# Patient Record
Sex: Female | Born: 1935 | Race: White | Hispanic: No | Smoking: Never smoker
Health system: Southern US, Community
[De-identification: ages and names within clinical notes are randomized; demographics above are authoritative.]

## PROBLEM LIST (undated history)

## (undated) DIAGNOSIS — H3530 Unspecified macular degeneration: Secondary | ICD-10-CM

## (undated) DIAGNOSIS — J449 Chronic obstructive pulmonary disease, unspecified: Secondary | ICD-10-CM

## (undated) DIAGNOSIS — F419 Anxiety disorder, unspecified: Secondary | ICD-10-CM

## (undated) DIAGNOSIS — I447 Left bundle-branch block, unspecified: Secondary | ICD-10-CM

## (undated) DIAGNOSIS — I4891 Unspecified atrial fibrillation: Secondary | ICD-10-CM

## (undated) DIAGNOSIS — F329 Major depressive disorder, single episode, unspecified: Secondary | ICD-10-CM

## (undated) DIAGNOSIS — I43 Cardiomyopathy in diseases classified elsewhere: Secondary | ICD-10-CM

## (undated) DIAGNOSIS — IMO0001 Reserved for inherently not codable concepts without codable children: Secondary | ICD-10-CM

## (undated) DIAGNOSIS — M1990 Unspecified osteoarthritis, unspecified site: Secondary | ICD-10-CM

## (undated) DIAGNOSIS — K219 Gastro-esophageal reflux disease without esophagitis: Secondary | ICD-10-CM

## (undated) DIAGNOSIS — R000 Tachycardia, unspecified: Secondary | ICD-10-CM

## (undated) DIAGNOSIS — I509 Heart failure, unspecified: Secondary | ICD-10-CM

## (undated) DIAGNOSIS — K5792 Diverticulitis of intestine, part unspecified, without perforation or abscess without bleeding: Secondary | ICD-10-CM

## (undated) DIAGNOSIS — I10 Essential (primary) hypertension: Secondary | ICD-10-CM

## (undated) HISTORY — DX: Tachycardia, unspecified: I43

## (undated) HISTORY — DX: Unspecified macular degeneration: H35.30

## (undated) HISTORY — DX: Tachycardia, unspecified: R00.0

## (undated) HISTORY — DX: Reserved for inherently not codable concepts without codable children: IMO0001

## (undated) HISTORY — DX: Unspecified atrial fibrillation: I48.91

## (undated) HISTORY — DX: Anxiety disorder, unspecified: F41.9

## (undated) HISTORY — DX: Unspecified osteoarthritis, unspecified site: M19.90

## (undated) HISTORY — DX: Essential (primary) hypertension: I10

## (undated) HISTORY — DX: Gastro-esophageal reflux disease without esophagitis: K21.9

## (undated) HISTORY — DX: Left bundle-branch block, unspecified: I44.7

## (undated) HISTORY — DX: Major depressive disorder, single episode, unspecified: F32.9

## (undated) HISTORY — DX: Diverticulitis of intestine, part unspecified, without perforation or abscess without bleeding: K57.92

## (undated) HISTORY — PX: OTHER SURGICAL HISTORY: SHX169

## (undated) HISTORY — DX: Chronic obstructive pulmonary disease, unspecified: J44.9

## (undated) HISTORY — DX: Heart failure, unspecified: I50.9

---

## 1980-01-18 HISTORY — PX: ABDOMINAL HYSTERECTOMY: SHX81

## 1991-01-18 HISTORY — PX: CHOLECYSTECTOMY: SHX55

## 2006-02-17 ENCOUNTER — Ambulatory Visit: Payer: Self-pay | Admitting: Internal Medicine

## 2006-03-02 ENCOUNTER — Ambulatory Visit: Payer: Self-pay | Admitting: Internal Medicine

## 2006-03-02 ENCOUNTER — Inpatient Hospital Stay (HOSPITAL_COMMUNITY): Admission: AD | Admit: 2006-03-02 | Discharge: 2006-03-07 | Payer: Self-pay | Admitting: Internal Medicine

## 2006-03-17 ENCOUNTER — Ambulatory Visit: Payer: Self-pay | Admitting: Internal Medicine

## 2006-05-02 ENCOUNTER — Inpatient Hospital Stay (HOSPITAL_COMMUNITY): Admission: RE | Admit: 2006-05-02 | Discharge: 2006-05-05 | Payer: Self-pay | Admitting: Orthopedic Surgery

## 2006-06-02 ENCOUNTER — Ambulatory Visit: Payer: Self-pay | Admitting: Internal Medicine

## 2006-12-11 ENCOUNTER — Ambulatory Visit: Payer: Self-pay | Admitting: Internal Medicine

## 2006-12-11 LAB — CONVERTED CEMR LAB
ALT: 20 units/L (ref 0–35)
Bilirubin, Direct: 0.1 mg/dL (ref 0.0–0.3)
Calcium: 9.3 mg/dL (ref 8.4–10.5)
GFR calc Af Amer: 89 mL/min
GFR calc non Af Amer: 73 mL/min
Glucose, Bld: 91 mg/dL (ref 70–99)
Magnesium: 2.1 mg/dL (ref 1.5–2.5)

## 2007-07-05 ENCOUNTER — Ambulatory Visit: Payer: Self-pay | Admitting: Internal Medicine

## 2008-06-25 ENCOUNTER — Encounter (INDEPENDENT_AMBULATORY_CARE_PROVIDER_SITE_OTHER): Payer: Self-pay | Admitting: *Deleted

## 2008-08-22 DIAGNOSIS — I4891 Unspecified atrial fibrillation: Secondary | ICD-10-CM | POA: Insufficient documentation

## 2008-08-22 DIAGNOSIS — M159 Polyosteoarthritis, unspecified: Secondary | ICD-10-CM | POA: Insufficient documentation

## 2008-08-22 DIAGNOSIS — I429 Cardiomyopathy, unspecified: Secondary | ICD-10-CM

## 2008-08-25 ENCOUNTER — Ambulatory Visit: Payer: Self-pay | Admitting: Internal Medicine

## 2008-09-10 ENCOUNTER — Telehealth: Payer: Self-pay | Admitting: Internal Medicine

## 2008-09-15 ENCOUNTER — Ambulatory Visit: Payer: Self-pay | Admitting: Internal Medicine

## 2008-09-16 IMAGING — CR DG CHEST 2V
2 series · 2 of 2 positions shown · non-contrast
Comparison: None.

CLINICAL DATA: Right knee osteoarthritis. Preoperative evaluation. History of
asthma.

CHEST - 2 VIEW

[view not recorded (1 of 2)]
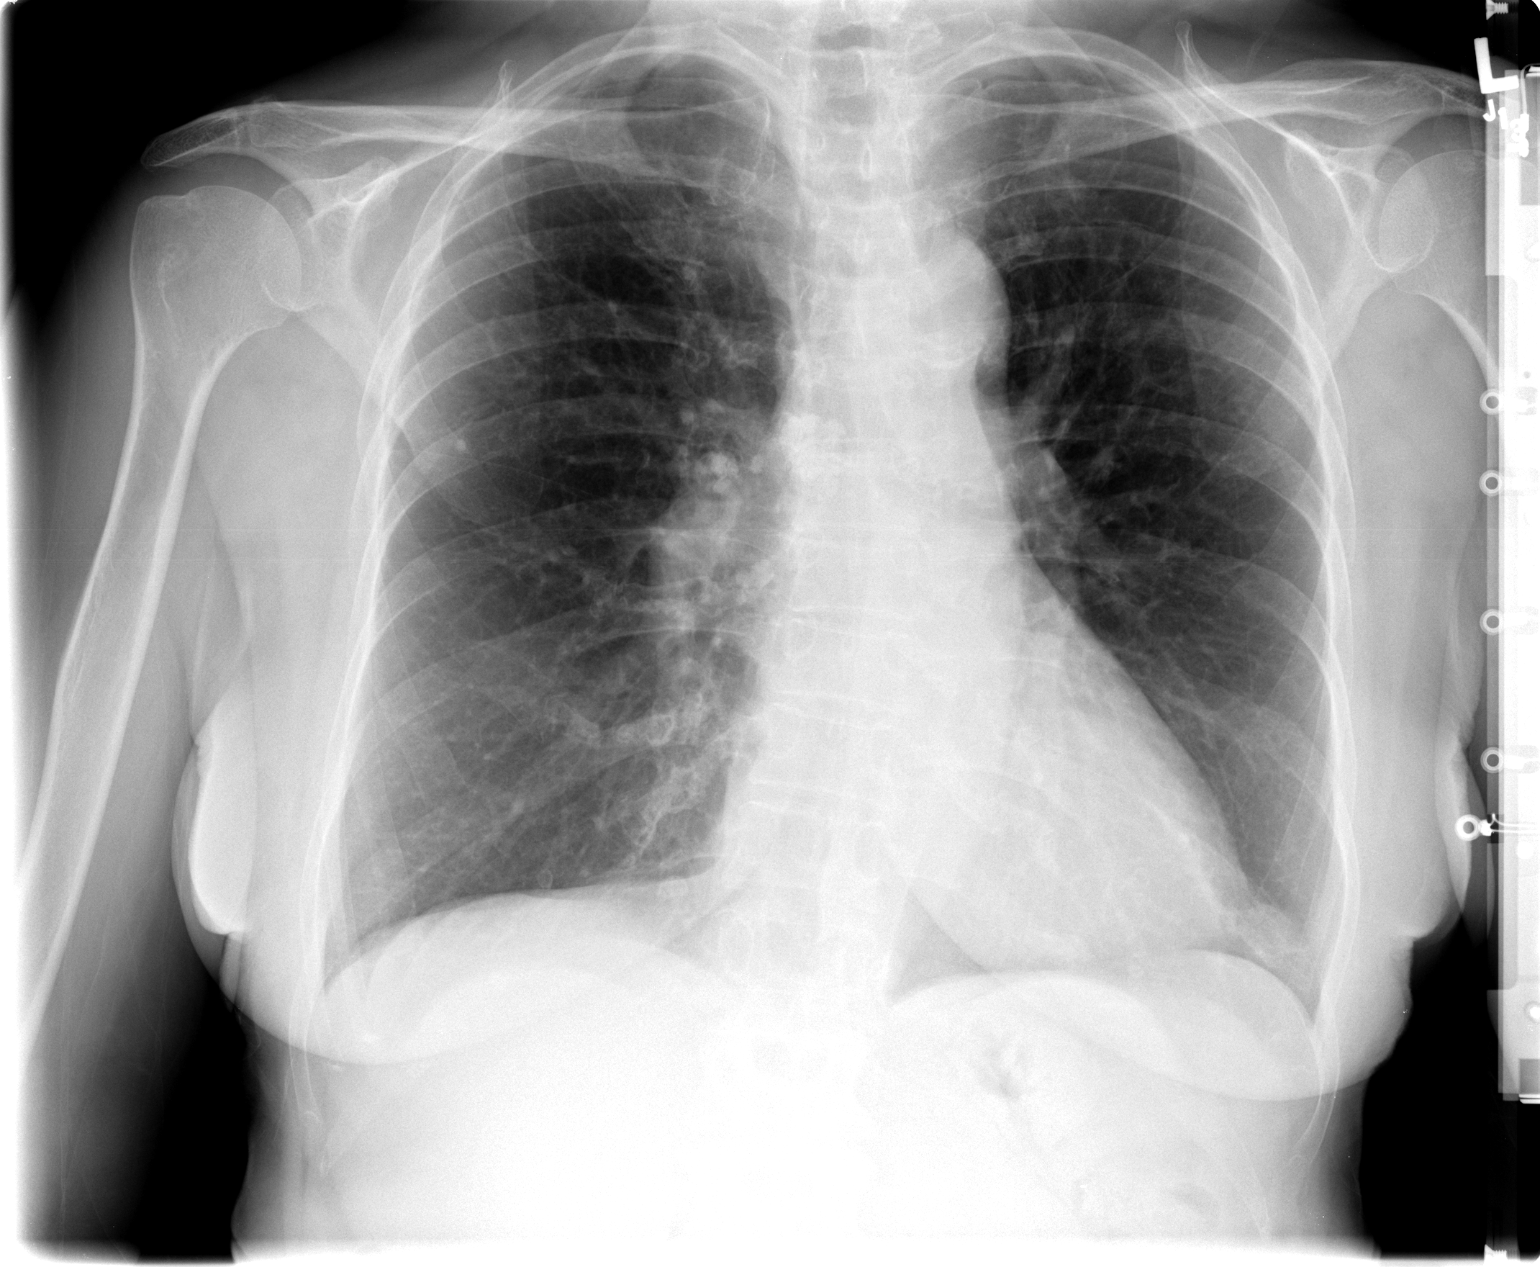

[view not recorded (2 of 2)]
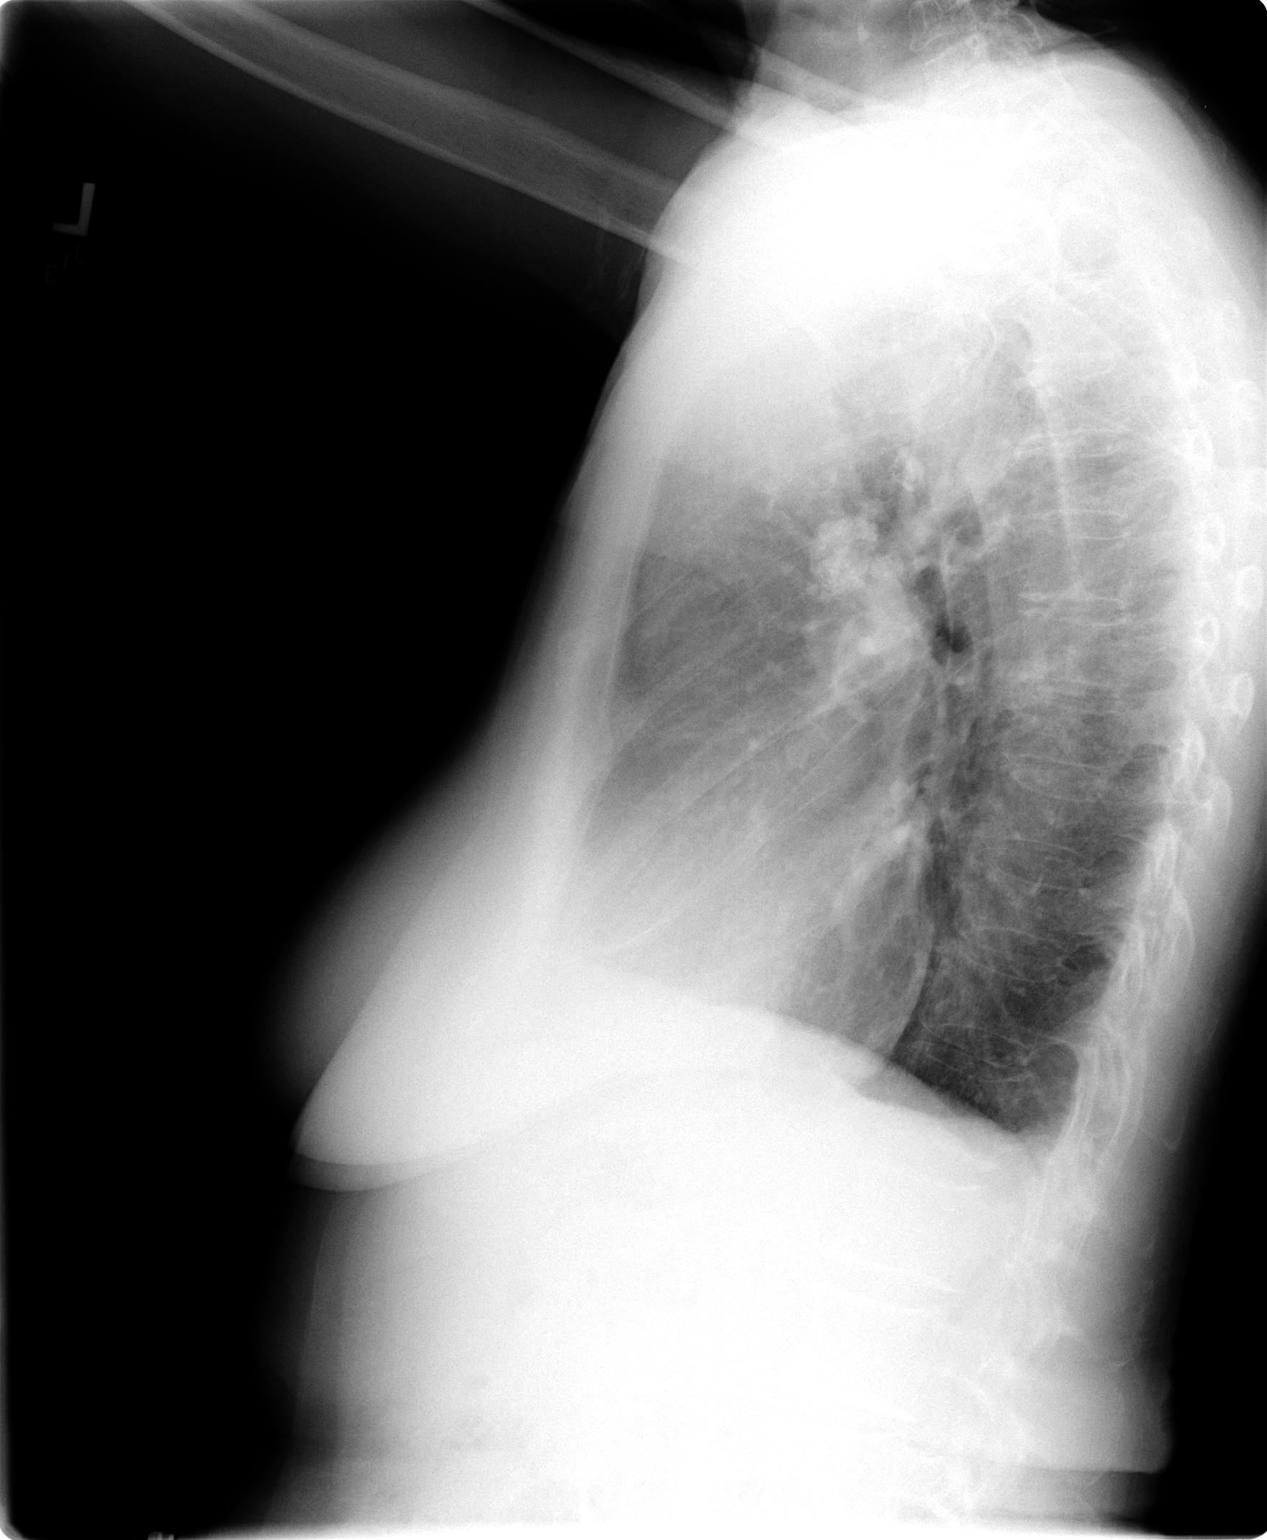

[2 of 2 positions shown; findings below may reference images not displayed]

FINDINGS: Borderline enlarged cardiac silhouette. Mild changes of COPD and
chronic bronchitis. Small right upper lung zone calcified granuloma and
associated calcified right hilar and mediastinal lymph nodes. Mild scoliosis.

IMPRESSION

1. Borderline cardiomegaly.
2. Mild changes of COPD and chronic bronchitis.
3. Previous granulomatous infection.

## 2008-10-27 ENCOUNTER — Ambulatory Visit: Payer: Self-pay | Admitting: Internal Medicine

## 2008-10-29 ENCOUNTER — Encounter: Payer: Self-pay | Admitting: Internal Medicine

## 2009-03-02 ENCOUNTER — Ambulatory Visit: Payer: Self-pay | Admitting: Internal Medicine

## 2009-06-05 ENCOUNTER — Telehealth (INDEPENDENT_AMBULATORY_CARE_PROVIDER_SITE_OTHER): Payer: Self-pay | Admitting: *Deleted

## 2009-06-09 ENCOUNTER — Ambulatory Visit: Payer: Self-pay | Admitting: Cardiovascular Disease

## 2009-06-12 ENCOUNTER — Telehealth: Payer: Self-pay | Admitting: Cardiovascular Disease

## 2009-07-28 ENCOUNTER — Telehealth: Payer: Self-pay | Admitting: Cardiovascular Disease

## 2009-09-11 ENCOUNTER — Ambulatory Visit: Payer: Self-pay | Admitting: Internal Medicine

## 2009-09-29 ENCOUNTER — Encounter: Payer: Self-pay | Admitting: Internal Medicine

## 2009-10-21 ENCOUNTER — Telehealth: Payer: Self-pay | Admitting: Internal Medicine

## 2010-02-16 NOTE — Progress Notes (Signed)
  Phone Note From Other Clinic   Action Taken: Provider Notified Summary of Call: Spoke with Dr. Tenny Craw after faxing copy of EKG and phone note-to get some labs on patient-CBC,BMP,Digoxin level,TSH-patient's BP now 125/60 and patient states feels some better.  Patient to go to [facility]. for labs and then go home.  If any further problems, to go to ED.  Scheduled f/u appt with patient to see Dr. Freida Busman on Tues. 5/24.

## 2010-02-16 NOTE — Progress Notes (Signed)
  Phone Note Call from Patient   Summary of Call: Patient stopped by office for BP check-[facility]p [facility] 86/43.  BP in office 122/73 with HR 69.  States she walks twice a day and this morning while walking felt more out of breath.  Advised against walking on such hot days and to increase fluid intake. Initial call taken by: Dessie Coma,  July 28, 2009 2:48 PM

## 2010-02-16 NOTE — Progress Notes (Signed)
  Phone Note Call from Patient   Caller: Patient Summary of Call: T.C. from patient to find out results of Echo-per Dr. Freida Busman, Echo was OK. Initial call taken by: Dessie Coma,  Jun 12, 2009 11:24 AM

## 2010-02-16 NOTE — Progress Notes (Signed)
  Phone Note Call from Patient   Caller: Patient Summary of Call: Patient stopped by office to have BP and HR checked.  Had colonoscopy and EGD last week and HR was in the 90s.  Today BP 129/76 HR 67.  Patient states she is under a lot of stress.  Family issues.  Initial call taken by: Dessie Coma  LPN,  October 21, 2009 10:16 AM

## 2010-02-16 NOTE — Letter (Signed)
Summary: [facility] Medical Clearance   [facility] Medical Clearance   Imported By: Roderic Ovens 10/16/2009 16:26:52  _____________________________________________________________________  External Attachment:    Type:   Image     Comment:   External Document

## 2010-02-16 NOTE — Progress Notes (Signed)
  Phone Note Call from Patient   Summary of Call: Patient stopped by office-c/o feeling shaky,nervous.  Has been to [facility] and nurse told her after checking her pulse that she thought she was in atrial fib.  The nurse did nothing else since she has moved her records to [facility] and is currently a patient of Dr. Odessa Fleming.  BP 140/66 HR 81 EKG done and will be faxed to [facility] for interpretation since there is not a  MD in office at present time.  Initial call taken by: Dessie Coma,  Jun 05, 2009 11:19 AM

## 2010-02-17 ENCOUNTER — Encounter: Payer: Self-pay | Admitting: Internal Medicine

## 2010-02-26 ENCOUNTER — Telehealth: Payer: Self-pay | Admitting: Cardiovascular Disease

## 2010-03-02 ENCOUNTER — Telehealth: Payer: Self-pay | Admitting: Cardiovascular Disease

## 2010-03-04 NOTE — Progress Notes (Signed)
  Phone Note Call from Patient   Caller: Patient Summary of Call: T.C. from patient-went to ED today for ashma attack.  Was given Cipro 500mg  two times a day for UTI and wonders if this has a reaction to Pradaxa.  Called and asked Dr. Kirke Corin, no it does not have reaction with Pradaxa per Dr. Kirke Corin. Initial call taken by: Dessie Coma  LPN,  February 26, 2010 3:47 PM

## 2010-03-08 ENCOUNTER — Ambulatory Visit (INDEPENDENT_AMBULATORY_CARE_PROVIDER_SITE_OTHER): Payer: Medicare Other | Admitting: Internal Medicine

## 2010-03-08 DIAGNOSIS — R197 Diarrhea, unspecified: Secondary | ICD-10-CM

## 2010-03-08 DIAGNOSIS — I4891 Unspecified atrial fibrillation: Secondary | ICD-10-CM

## 2010-03-10 NOTE — Progress Notes (Signed)
Summary: shakiness  Phone Note Call from Patient   Caller: Patient Summary of Call: Stopped by office-c/o feeling shakey.  BP 138/68 and O2 Sat 98%.  HR 76.To calm self down and let us know if continues and chest pain or problems start. Initial call taken by: Dessie Coma  LPN,  March 02, 2010 3:35 PM

## 2010-03-19 ENCOUNTER — Telehealth: Payer: Self-pay | Admitting: Internal Medicine

## 2010-03-30 NOTE — Miscellaneous (Signed)
Summary: [facility]   [facility]   Imported By: Kassie Mends 03/24/2010 10:38:57  _____________________________________________________________________  External Attachment:    Type:   Image     Comment:   External Document

## 2010-03-30 NOTE — Progress Notes (Signed)
Summary: update on med  Phone Note Call from Patient   Caller: Patient [phone] or [phone] Reason for Call: Talk to Nurse, Lab or Test Results Summary of Call: pt on new med xarelto dr Graciela Husbands gave her samples -to call with update on how she's doing at last dose  Initial call taken by: Glynda Jaeger,  March 19, 2010 9:16 AM  Follow-up for Phone Call        Pt. called to let Dr. Graciela Husbands know that she had not have any side effects fron Xarelto 20 mg once a day medication.  [facility]. A prescription for 90 days supply and 3 refills for medication for mail order written for pt. as requested. Pt will come to the office for  prescription on monday 03/22/10. Follow-up by: Ollen Gross, RN, BSN,  March 19, 2010 9:56 AM    New/Updated Medications: XARELTO 20 MG TABS (RIVAROXABAN) take one tablet by mouth once a day XARELTO 20 MG TABS (RIVAROXABAN) take one tablet by mouth daily Prescriptions: XARELTO 20 MG TABS (RIVAROXABAN) take one tablet by mouth daily  #90 x 3   Entered by:   Ollen Gross, RN, BSN   Authorized by:   Nathen May, MD, [facility]   Signed by:   Ollen Gross, RN, BSN on 03/19/2010   Method used:   Print then Give to Patient   RxID:   [id number] XARELTO 20 MG TABS (RIVAROXABAN) take one tablet by mouth once a day  #30 x 0   Entered by:   Ollen Gross, RN, BSN   Authorized by:   Nathen May, MD, [facility]   Signed by:   Ollen Gross, RN, BSN on 03/19/2010   Method used:   [facility]  [address] [id number]       [address]       [place], [place]  [address]       Ph: [phone] or [phone]       Fax: [phone]   RxID:   [id number]   Appended Document: update on med PT AWARE XARELTO SCRIPT LEFT AT FRONT DESK FOR PT TO PICK UP./CY

## 2010-05-24 ENCOUNTER — Telehealth: Payer: Self-pay | Admitting: Internal Medicine

## 2010-05-24 DIAGNOSIS — I4891 Unspecified atrial fibrillation: Secondary | ICD-10-CM

## 2010-05-24 NOTE — Telephone Encounter (Signed)
SPOKE WITH [name] PHARMAC[facility]  LISTED SIDE EFFECTS IN PHONE NOTE ARE NOT RELATED TO XARELTO PER Joanna PT AWARE PT STATES HAS NOT TOLERATED COUMADIN,PRADAXA, OR NOW XARELTO  WANTS TO ONLY TAKE ASA INFORMED ASA THERAPY ALONE IS NOT ENOUGH TO PREVENT STROKE D/T AFIB  WILL DISCUSS WITH DR KLEIN./CY

## 2010-05-24 NOTE — Telephone Encounter (Signed)
Pt states she is having side effects to Xarelto.  Not sleeping, headache, nausea, and her asthma has been bad.  Her pulmonary doctor told her to advise Dr. Graciela Husbands of same.  Can she try ASA?

## 2010-05-25 NOTE — Telephone Encounter (Signed)
Jonne Ply is better than nothing, but  Worse than other options.  If seh is willing to take plavix that offers incremental benefit

## 2010-05-28 MED ORDER — CLOPIDOGREL BISULFATE 75 MG PO TABS: 75.0000 mg | ORAL_TABLET | Freq: Every day | ORAL | Status: DC

## 2010-05-28 NOTE — Telephone Encounter (Signed)
I called and spoke with the pt. I advised her of Dr. Odessa Fleming recommendations for ASA with Plavix 75mg  once daily. The pt is agreeable with this. She will d/c Xarelto and start ASA 81mg  daily and Plavix 75mg  once daily. I will call the [facility] in [facility] [place]. She would like a 30 day supply locally and would also like a 90 RX mailed to her home. I have explained to her that she will not have quite the benefit as she would with a blood thinner, but she would still like to try plavix and ASA. She would like to know when Dr. Graciela Husbands would like to see her back. I will find out and let her know. She would like to f/u in [place].

## 2010-06-01 NOTE — Letter (Signed)
July 05, 2007    Feliciana Rossetti, MD  [address]  [place], [place] [address]   RE:  [name], [name]  MRN:  [id number]  /  DOB:  [birth year]   Dear Tammy Sours:   It was a pleasure to see [name] today in followup for her atrial  fibrillation.  As you know, she has a resolved tachycardia-induced  cardiomyopathy and is doing well without symptoms of chest pain,  shortness of breath, or palpitations.   She is taking her Tikosyn.  She said blood work was obtained the other  day (see below).   Her skin issues are improved.  There is some blotchiness, which is from  the Coumadin and some itchiness, which persists.   Her other medications including the above includes Coumadin, Lexapro,  Nexium, and lisinopril.   PHYSICAL EXAMINATION:  VITAL SIGNS:  Her blood pressure was 124/60 with  a pulse of 59.  LUNGS:  Clear.  CARDIAC:  Heart sounds were regular.  ABDOMEN:  Soft.  EXTREMITIES:  Without edema.   Her electrocardiogram dated today demonstrated sinus rhythm at 50 with  intervals 0.18/0.15/0.51.  The axis was leftward and -50.  She has  persistent left bundle-branch block.   IMPRESSION:  1. Paroxysmal atrial fibrillation.  2. Resolved tachycardia-induced cardiomyopathy.  3. Left bundle-branch block.   [name] is doing quite well.  The ongoing surveillance issue, Tammy Sours,  is potassium and magnesium, and these should be greater than 4 and  greater than 2.  We should probably be checking those every 6 months,  and I know you drew blood work just the other day, so I will defer that  surveillance to you.   We will plan to see her again in 1 year's time.   If there is anything we can do in the interim, please do not hesitate to  contact me.    Sincerely,      Duke Salvia, MD, [facility]  Electronically Signed    SCK/MedQ  DD: 07/05/2007  DT: 07/05/2007  Job #: [id number]   CC:    Harl Bowie, M.D.

## 2010-06-01 NOTE — Letter (Signed)
December 11, 2006    Feliciana Rossetti, MD  [address]  [place], [place] [address]   RE:  [name], [name]  MRN:  [id number]  /  DOB:  1924-12-21   Dear Tammy Sours,   I hope this letter finds you well and I hope you and your family had a  great Thanksgiving.   [name] [name] for her atrial fibrillation followup for which  she take Tikosyn. She also has ischemic heart disease and normal left  ventricular function. She has had no recurrent atrial fibrillation, no  problems with her breathing.   She does have a couple of concerns. The first is an increase in her  weight over the last 3-4 weeks. She has been on prednisone for some  time. There has been some change in the swelling of her ankles but not  significantly.   She also notes that her skin has become blotchier and itchy. This is  worse since the Tikosyn was added. It is also concomitant with her  prednisone.   MEDICATIONS:  Her medications currently include the:  1. Prednisone 5 q.o.d. which is apparently scheduled to be stopped.  2. Lisinopril 10.  3. Lexapro 20.  4. Singulair.  5. Ipratropium.  6. Tikosyn 250 as noted.  7. Coumadin.  I do not see that she is on a beta blocker.   PHYSICAL EXAMINATION:  VITAL SIGNS:  Her blood pressure is a little bit  high [name] at 136/77, her pulse is 63, her weight was 138.  Her neck veins were 8-9 cm.  LUNGS:  Clear.  HEART:  Heart sounds were regular with an S4.  There was mild right upper quadrant tenderness. The abdomen was soft.  EXTREMITIES:  Trace peripheral edema.  NEUROLOGIC:  Grossly normal.   Electrocardiogram  dated [name] demonstrated sinus rhythm at 54 with  impedance of 0.17/0.15/0.51, the axis was leftward at -50.   IMPRESSION:  1. Atrial fibrillation on Tikosyn holding at sinus rhythm.  2. Recent weight gain.      a.     Elevated jugular venous pressure.      b.     Mild peripheral edema.      c.     On prednisone being tapered off.  3. Coumadin therapy.  4.  Ischemic heart disease with:      a.     Normal left ventricular function.      b.     Status post bypass.  5. Right upper quadrant tenderness.   [name] [name], is doing well from an atrial fibrillation point of  view. There are a couple of concerns which may be attributable to the  prednisone and I thought we should watch and wait to see how she did  prior to intervening. The first is the skin issue which I hope is a  prednisone issue and not a Tikosyn issue. I will have to do some looking  to see whether Tikosyn might be associated with this. The second thing  is the edema. Edema in the presence of Tikosyn is a little bit harder to  treat because we cannot use a thiazide-containing diuretic and we end up  having to use low-dose furosemide, which is a baby of a dose requiring  very close attention to potassium and magnesium levels, the targets of  which should be greater than 1.82 and greater than 4.   Because these have not been checked recently and because she does have  some require upper quadrant tenderness, will plan to check a CMET [name]  and magnesium. I will forward these results to you. She asked about her  cholesterol and I told her she could get them fasting with you when she  sees you next month for her physical as she had had coffee this morning.   Tammy Sours, thanks very much for allowing [place] to participate in her care. If  there is anything I can do further, please do not hesitate to contact  me.    Sincerely,      Duke Salvia, MD, [facility]  Electronically Signed    SCK/MedQ  DD: 12/11/2006  DT: 12/11/2006  Job #: [id number]   CC:    Harl Bowie, M.D.

## 2010-06-01 NOTE — Letter (Signed)
September 11, 2009    Feliciana Rossetti, MD  [address]  [place], [place]  [address]   RE:  THERA, BASDEN  MRN:  [id number]  /  DOB:  1924-10-21   Dear Tammy Sours,   Ms. [name] comes in today in follow-up for her atrial fibrillation.  She  has had no clear recurrent symptoms.  She did have an episode a couple  of months ago where she felt she was in atrial fibrillation.  She went  to [facility].  They told her that she was no longer a patient there,  but they would check her vital signs.  Upon checking her blood pressure,  the person said that she was in atrial fibrillation and sent her back  over here.  By the time she got here, she was in sinus rhythm by  electrocardiogram.   Laboratories at that time demonstrated addition level that was normal at  0.96.  Her TSH was also normal.   She continues currently on Lanoxin, lisinopril, Lexapro and Pradaxa.   I should know parenthetically that she is under a great deal of stress  as her daughter-in-law is severely depressed related to loss of job and  non diagnosed causes of acute recurrent angioedema.   Her blood pressure today was 131/76.  Her pulse was 70.  Her lungs were  clear.  HEART:  Sounds were regular.  There was no significant edema.  The abdomen was soft and she was alert and oriented although being  somewhat sad.   IMPRESSION:  1. Atrial fibrillation.  2. Significant psychosocial stress.  3. Unresolved tachycardia induced cardiomyopathy.   DISCUSSION:  Ms. Anorah, Tri[place], came in saying that she wants to come off  of Pradaxa.  She did not want to take her lisinopril, and she was not  sure about the digoxin.   I told her that I think that stopping the Lanoxin made sense with a  normal left ventricular function.  In the event that she has recurrent  symptomatic atrial fibrillation, I would try beta-blocker anyway given  her cardiomyopathy.   As relates to her lisinopril, I told her we are not sure about what the  appropriate duration is for people who have had resolved tachycardia  induced cardiomyopathy.  Some, including myself, keep people on ACE  inhibitors long-term.  Others have felt that with resolution the  medications can be stopped.  She was agreeable to continuing it.   As related to the Pradaxa, I thought that it was very important that she  continue it.  Actually, I told that it would be foolish to stop given  her intercurrent symptoms of atrial fibrillation and the data from the  AFFIRM trial.   She was agreeable to continuing this.   We will plan to see her again in six months time.    Sincerely,      Duke Salvia, MD, [facility]  Electronically Signed    SCK/MedQ  DD: 09/11/2009  DT: 09/11/2009  Job #: [id number]

## 2010-06-01 NOTE — Assessment & Plan Note (Signed)
[facility]                        [facility] OFFICE NOTE   NAME:[name], [name]                        MRN:          [id number]  DATE:06/09/2009                            DOB:          [birth year]    PROBLEM LIST:  1. History of atrial fibrillation.  2. History of tachycardia-induced cardiomyopathy.  3. Dyslipidemia.   INTERVAL HISTORY:  Four days prior to this office visit, [name] woke  up with some mild weakness and soreness in her chest.  She states that  she went to [facility] where they told her pulse was irregular  and that she come to [facility] as she is a patient of Dr. Odessa [name].  Here  in the office, the patient's vital signs were stable and EKG  demonstrated normal sinus rhythm with a rate of 62 beats per minute.  There was left anterior fascicular block and a left bundle-branch block.  The patient had labs subsequently drawn which showed a normal BNP,  digoxin level 0.96, TSH of 1.6, and CBC that was within normal limits.  The patient states that her main symptom other than the mild soreness  was feeling nervous.  Of note, Thursday, the day prior to her initial  presentation, she exercised a little more than she usually does with  floor aerobics and weights.  The patient states that over the weekend,  the mild soreness in her chest continued.  It was clearly worse with  palpation and deep inspiration.  It has now completely resolved.  She  has not noted any palpitations and she checks her pulse occasionally and  it has been regular.   PHYSICAL EXAMINATION:  VITAL SIGNS:  Today; blood pressure is 124/64,  pulse 79, satting 96% on room air, and she weighs 134 pounds.  GENERAL:  No acute distress.  HEART:  Regular rate and rhythm without murmur.  LUNGS:  Clear.  ABDOMEN:  Soft, nontender.  EXTREMITIES:  Without edema.  SKIN:  Warm and dry.   I reviewed the patient's EKG and labs as above.   ASSESSMENT AND PLAN:  It  is certainly possible that the patient had an  episode of atrial fibrillation that resolved by the time she reached  their office on Friday.  Her symptoms have completely resolved and she  feels well.  She continued on Pradaxa therapy.  We have offered the  patient an 48-hour Holter monitor, but she wishes to defer this, to see  if she has any recurrent symptoms.  We will recheck a transthoracic  echocardiogram to assure normal left ventricular systolic function.  If  the symptoms return, she will contact our office.  She will follow  up with [name].  I see no need at this point for ischemia evaluation  as the pain was very  typical for musculoskeletal pain and she had a clear reason to have  musculoskeletal pain with the increased exercises she performed the day  prior to the initial presentation.     Brayton El, MD  Electronically Signed    SGA/MedQ  DD: 06/09/2009  DT: 06/10/2009  Job #: [id number]

## 2010-06-01 NOTE — Letter (Signed)
Jun 02, 2006    Harl Bowie, M.D.  [address]  [place], [place] [address]   RE:  [name], [name]  MRN:  [id number]  /  DOB:  02/01/1924   Dear Lenard Galloway:   It was a pleasure to talk to you the other day.  Thank you so much for  your concern.   [name] is doing terrific following your recommendation to initiate  Tikosyn.  She has had no symptomatic recurrent atrial fibrillation.  She  has undergone knee replacement surgery, and tolerated that amazingly  well, and she is going through recovery.  She is quite anorectic at this  point, only recently beginning to be able to eat.   I assumed somebody has checked her lab work.   Her medications are legion and are notable for Lanoxin being used  initially for rate control with her atrial fibrillation.   EXAMINATION:  Her blood pressure was 113/63.  Her pulse was 76.  LUNGS:  Were clear.  HEART:  Sounds were regular.  EXTREMITIES:  Without edema.   Electrocardiogram dated today demonstrated sinus rhythm at 70 with  intervals of 0.17/0.13/0.44.  The axis was leftward at -60.   IMPRESSION:  1. Atrial fibrillation - paroxysmal.  2. Tikosyn therapy for number 1.  3. Status post total knee replacement.  4. Anorexia with the above.   Lenard Galloway, I thought [name] looked terrific today.  I did suggest that  she discontinue her Lanoxin, which was a rate controlling drug, and not  being used for LV dysfunction.  We will plan to see her again in 6  months' time for maintenance of her Tikosyn.    Sincerely,      Duke Salvia, MD, [facility]  Electronically Signed    SCK/MedQ  DD: 06/02/2006  DT: 06/02/2006  Job #: [id number]   CC:    Feliciana Rossetti, MD

## 2010-06-01 NOTE — Letter (Signed)
October 27, 2008    Feliciana Rossetti, MD  [address]  [place], [place] [address]   RE:  [name], [name]  MRN:  [id number]  /  DOB:  [birth year]   Dear Tammy Sours:   This letter comes to you from the [facility].  I hope you are doing  well.  Ms. Carnathan comes in followup for tachycardia-induced  cardiomyopathy related to atrial fibrillation.  We are trying to  maintain her on Tikosyn; however, she found the side effects intolerable  and she has come off.  She has had no intercurrent atrial fibrillation  of which she is aware, although she has no palpitations (please see  below).   Her last assessment of ejection fraction was reportedly normal.  She is  supposed to be getting a repeat assessment by Dr. Sherlyn Lick later on this  week.   Her medications currently include Coumadin, lisinopril.  She is  currently not on a beta-blocker.   On examination, her blood pressure is 112/66, her pulse was 71, her  weight was 136.  Her neck veins were flat.  Her lungs were clear.  Heart  sounds were regular with an S4.  The abdomen was soft and extremities  had no edema.   IMPRESSION:  1. Paroxysmal atrial fibrillation.  2. Tachycardia-induced cardiomyopathy with improved LV function.  3. Congestive heart failure - chronic - systolic, now largely abated.   She is to be getting an echo with Dr. Sherlyn Lick later this week.  She  continues to loathe taking her Coumadin and she is looking forward to  the introduction of dabigatran hopefully which will be out later this  year or early 2011.   We will plan to see her back in about four months' time.   We look forward to getting a copy of her echo from Dr. Sherlyn Lick.    Sincerely,      Duke Salvia, MD, [facility]  Electronically Signed    SCK/MedQ  DD: 10/27/2008  DT: 10/28/2008  Job #: [id number]

## 2010-06-01 NOTE — Telephone Encounter (Signed)
Per Dr. Graciela Husbands, he would like to see the patient back in [place] in 3 months. The patient is aware and will call back to schedule.

## 2010-06-01 NOTE — Assessment & Plan Note (Signed)
[facility]                         CARDIOLOGY OFFICE NOTE   [name], [name]                        MRN:          [id number]  DATE:03/02/2009                            DOB:          1924-12-09    [name] was seen in followup for an atrial fibrillation with a  tachycardia-induced cardiomyopathy.  She was previously on Tikosyn,  however, it was intolerable and she came off.  She does take her pulse  daily and has had no evidence of irregularity.   She also underwent a recent assessment of her left ventricular function  by Dr. Sherlyn Lick.  That was unchanged.   Intercurrently, he has also stopped her Coumadin and put her on Pradaxa.   She is also frustrated by number of other medications including Lanoxin,  recently initiated lisinopril, Lexapro and Symbicort.   On examination; her blood pressure today is 130/71, her pulse is 67.  Her neck veins were flat.  Her lungs were clear.  Her heart sounds were  regular with an S4.  The abdomen was soft.  The extremities had no  edema.  There is no jugular venous distention.   IMPRESSION:  1. Atrial fibrillation.  2. Tachycardia-induced cardiomyopathy.  3. Dyslipidemia.   Ms. [name] is stable.  Her left ventricular function remains normal.  She should stay on her ACE inhibitor.   She is tolerating her Pradaxa, notwithstanding her antecedent history of  GE reflux disease.  We reviewed the side effects again.   As I relates to her lipids, she is concerned about taking the  simvastatin.  Given the absence of coronary artery disease, I suggested  that one option might be to take red yeast rice.  She will try that and  then followup with you about this, Tammy Sours.     Duke Salvia, MD, [facility]  Electronically Signed    SCK/MedQ  DD: 03/02/2009  DT: 03/03/2009  Job #: [id number]   cc:   Feliciana Rossetti, MD  Harl Bowie, M.D.

## 2010-06-04 NOTE — H&P (Signed)
[name], [name]                 ACCOUNT NO.:  [id number]   MEDICAL RECORD NO.:  [id number]          PATIENT TYPE:  INP   LOCATION:  2037                         FACILITY:  [facility]   PHYSICIAN:  Duke Salvia, MD, FACCDATE OF BIRTH:  September 04, 1924   DATE OF ADMISSION:  03/02/2006  DATE OF DISCHARGE:  03/07/2006                              HISTORY & PHYSICAL   PRIMARY CARDIOLOGIST:  Dr. Sherryl Manges.   PRIMARY CARE PHYSICIAN:  Dr. Sherlyn Lick and Dr. Feliciana Rossetti.   HISTORY OF PRESENT ILLNESS:  This is an 77 year old female patient, well  known to Dr. Sherryl Manges, with a history of atrial fibrillation,  diagnosed August of 2007 when the patient presented to the hospital with  dyspnea, nausea, vomiting and diarrhea.  Patient also has a history of  left ventricular dysfunction.  The patient underwent a Cardiolite study,  which showed inferior infarct with mild periinfarct ischemia and she had  been treated medically.   The patient had symptoms of exercise tolerance, possibly contributing to  her atrial fibrillation, although she does not experience palpitations.  Her thromboembolic risk factors are notable for a decreased left  ventricular ejection fraction and age.  [name] atrial fibrillation  and rapid ventricular response and cardiomyopathy has been evaluated per  Dr. Graciela Husbands in the past with rate control being a priority.  Discussion  for [place] cardioversion was also had with the patient.  The patient  declined DSS cardioversion and preferred to be treated with medical  therapy prior to AV ablation and/or pacemaker.  The patient is admitted  for Tikosyn loading.   PAST MEDICAL HISTORY:  1. 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  Stress Myoview revealing the presence of inferior infarct      with mild periinfarct ischemia.  2. Gastroesophageal reflux disease.  3. Anxiety.  4. Asthma.  5. Arthritis.   PAST CARDIAC EVALUATION:  Myocardial  perfusion study as stated above.   MEDICATIONS PRIOR TO HOSPITALIZATION:  1. Xopenex and ipratropium nebulizer therapy.  2. Toprol-XL 50 mg 1/2 tablet daily.  3. Lexapro 10 mg daily.  4. Nexium 40 mg daily.  5. Lanoxin 0.25 mg daily.  6. Pravastatin 20 mg 1/2 tablet daily at bedtime.  7. Singulair 10 mg daily.  8. Coumadin 5 mg daily.  9. Lisinopril 10 mg daily.  10.Ranitidine 300 mg daily.  11.Nasonex 1 puff daily.  12.Asmanex 2 puffs twice daily.  13.Multivitamin daily.  14.Xolair injections twice monthly.  15.Coumadin 5 mg once a day.  16.Nexium once a day.   ALLERGIES:  NO KNOWN DRUG ALLERGIES.   LABS:  PT 28.8, INR 2.5.  Sodium 138, potassium 4.2, chloride 104, CO2  25, glucose 103, BUN 19, creatinine 0.75, calcium 9.2, magnesium 2.4.  BNP 111.0.  Digoxin 1.4.   PHYSICAL EXAMINATION:  VITAL SIGNS:  On admission, blood pressure  126/85, heart rate 82, respirations 18, temperature 97.4, O2 saturation  95% on room air.  GENERAL:  She is awake, alert and oriented in no acute distress.  HEENT:  Head is normocephalic and  atraumatic.  Eyes:  PERRLA.  Mucous  membranes:  Mouth pink and moist.  Tongue is midline.  NECK:  Supple without JVD or carotid bruits appreciated.  CARDIOVASCULAR:  Irregular rate and rhythm without murmurs, rubs or  gallops.  LUNGS:  Clear to auscultation.  ABDOMEN:  Soft, nontender, 2+ bowel sounds.  EXTREMITIES:  Without clubbing, cyanosis or edema.   PLAN:  This is an 77 year old Caucasian female who presents to the  hospital for Tikosyn loading per Dr. Clayborne Artist evaluation for  medical management of atrial fibrillation with rapid ventricular  response in lieu of [facility] at this time with discussion for followup [facility] if  this is unsuccessful.  The patient has had risks and benefits of Tikosyn  loading discussed per Dr. Graciela Husbands and Dr. Ladona Ridgel and she is willing to  proceed.  We will follow the patient throughout hospitalization making  further  recommendations and need for other interventions at Dr. Graciela Husbands  and Dr. Lubertha Basque discretion.      [name]. Lyman Bishop, NP      Duke Salvia, MD, [facility]  Electronically Signed    KML/MEDQ  D:  04/17/2006  T:  04/17/2006  Job:  [id number]   cc:   Sherlyn Lick, M.D.  Feliciana Rossetti, MD

## 2010-06-04 NOTE — Discharge Summary (Signed)
NAMELINNET, BOTTARI                 ACCOUNT NO.:  [id number]   MEDICAL RECORD NO.:  [id number]          PATIENT TYPE:  INP   LOCATION:  1516                         FACILITY:  [facility]   PHYSICIAN:  Madlyn Frankel. Charlann Boxer, M.D.  DATE OF BIRTH:  January 09, 1925   DATE OF ADMISSION:  05/02/2006  DATE OF DISCHARGE:  05/05/2006                               DISCHARGE SUMMARY   ADMITTING DIAGNOSES:  1. Osteoarthritis.  2. Reflux disease.  3. Asthma.  4. Anxiety disease.   DISCHARGE DIAGNOSES:  1. Osteoarthritis.  2. Asthma.  3. reflux disease.  4. Anxiety/depression.   CONSULTATIONS:  None.   PROCEDURE:  Right total knee replacement.   SURGEON:  Madlyn Frankel. Charlann Boxer, M.D.   ASSISTANT:  Dwyane Luo, PA-C.   HISTORY OF PRESENT ILLNESS:  An 77 year old female with history of  persistent progressive right knee pain.  This has been refractory to all  conservative treatments.  She was seen by her primary care as well as  cardiologist preoperatively.  She does have a history of atrial  fibrillation as well as heart disease with prior myocardial infarction  and decreased LV function.  Will have them on board for medical  assistance while in the hospital.   LABORATORY DATA:  CBC preadmission hematocrit 35.9, postop day #1 white  blood cells 12.5, hematocrit 30.1.  Postop day #2 hematocrit was 29 and  stable.  Coagulation preadmission INR 1.  Postop day #2 INR was 1.4.  Chemistries preadmission all within normal limits.  Postop day #1 her  glucose elevated with 132, next day glucose 108.  All other stable.  Chemistries:  GFR remained greater than 60 throughout.  Calcium remained  stable although slightly decreased with an 8.2 on discharge.  Chemistries, GI workup all negative.  UA:  Trace leukocyte esterase, otherwise negative.   RADIOLOGY:  Chest two-view.  Impression:  1. Borderline cardiomegaly.  2. Mild changes of COPD and chronic bronchitis.  3. Previous granulomatous infection.   EKG March 06, 2006, showed a sinus rhythm with PACs, aberrant  conduction, PVCs, left axis deviation, left bundle branch block.  This  was reviewed by Duke Salvia, MD, [facility].   HOSPITAL COURSE:  The patient underwent total right total knee  replacement, tolerated procedure well and was admitted to orthopedic  floor.  Pharmacy was brought on board to help with Coumadin management  and INR management.  Postop day #1, we saw the patient, she was doing  well.  She had no acute event.  She was afebrile.  Hematocrit 30.1.  Chemistries were normal.  She was able to do a straight leg raise with  her right lower extremity.  She was neurovascularly intact as well.  PT/OT was begun, weightbearing as tolerated.  DVT prophylaxis was  Coumadin with a history of atrial fibrillation.  PT evaluation done  recommending home health care PT.  Again, Coumadin was done, her INR was  1.1.  We used Lovenox as a bridge until she reached therapeutic levels.   Postop day #2, doing well.  Dressing was changed, wound had no active  drainage.  She continued be neuromuscularly and vascularly intact, quads  were firing.  Continued weightbearing as tolerated with plans for  discharge the next day.  Coumadin management via pharmacy showed her INR  to be 1.4.   Postop day #3, she was a little bit apprehensive about going home, but  the doing well with minimum pain.  Dressing was changed.  She had no  active drainage.  She had minimum swelling.  She was ready for discharge  home after PT x2.  We also continued Lovenox bridge until INR reached a  therapeutic level above 2.  All Rx was on the chart.  Pharmacy came down  before she left.  INR was 124.  Plan was for 7.5 mg dose before  discharge and then resume home dosage of 5 mg per day.   DISCHARGE DISPOSITION:  Discharged home, home health care PT in stable  and improved condition, weightbearing as tolerated on the right lower  extremity.   DISCHARGE PHYSICAL THERAPY:  Work  on gait training, proprioception,  minimize pain, maximize strength, increase range of motion and encourage  activities of daily living.   DISCHARGE WOUND CARE:  Keep dry, change dressing on a daily basis.   DISCHARGE FOLLOWUP:  Follow up Dr. Charlann Boxer, [phone], in two weeks.   DISCHARGE MEDICATIONS:  1. Lovenox 30 mg one subcu until INR therapeutic above 2.  2. Coumadin 5 mg per day with INR measurement.  3. Vicodin 5/325 one to two p.o. q.4-6h. p.r.n. pain.  4. Robaxin 500 mg one p.o. q.6h. muscle spasm.  5. Colace 100 mg one p.o. b.i.d. constipation.  6. MiraLax 17 grams one p.o. daily constipation.   HOME MEDICATIONS:  1. Singulair 10 mg one p.o. daily.  2. Nexium 40 mg one p.o. b.i.d.  3. Lanoxin 0.25 mg one p.o. q.a.m.  4. Lisinopril 10 mg one p.o. daily.  5. Ranitidine 300 mg one p.o. daily.  6. Pravastatin half tablet 20 mg one p.o. nightly.  7. Tikosyn 0.25 mg one p.o. at 7 a.m. and one p.o. at 7 p.m.  8. Lexapro 10 mg one p.o. daily.  9. Proventil inhaler p.r.n.  10.Asmanex 2 puffs twice daily.  11.Nasonex one q.a.m.  12.Ipratropium bromide and Xopenex nebulizer p.r.n..   DISCHARGE SPECIAL INSTRUCTIONS:  1. Ice liberally.  2. TED hose on 12, off 12.  3. If develop any acute shortness of breath or severe calf pain please      call emergency services immediately.  4. If develop temperature of greater than 101.5 which is unresponsive      to Tylenol, give our office call.     ______________________________  Yetta Glassman. [name], [place]      Madlyn Frankel. Charlann Boxer, M.D.  Electronically Signed    BLM/MEDQ  D:  05/18/2006  T:  05/18/2006  Job:  [id number]

## 2010-06-04 NOTE — Op Note (Signed)
Joanna, [name]                 ACCOUNT NO.:  [id number]   MEDICAL RECORD NO.:  [id number]          PATIENT TYPE:  INP   LOCATION:  2037                         FACILITY:  [facility]   PHYSICIAN:  Doylene Canning. Ladona Ridgel, MD    DATE OF BIRTH:  1924/09/18   DATE OF PROCEDURE:  03/04/2006  DATE OF DISCHARGE:                               OPERATIVE REPORT   CARDIOLOGIST:  Doylene Canning. Ladona Ridgel, MD.   PROCEDURE PERFORMED:  Direct-current cardioversion.   INDICATIONS:  Symptomatic atrial fibrillation.   INTRODUCTION:  The patient is an 77 year old woman, who has subsequently  developed atrial fibrillation with rapid ventricular response and a  tachycardia-induced cardiomyopathy.  The patient has been admitted to  the hospital to be initiated on Tikosyn therapy.  She has been given 3  doses of Tikosyn and is now referred for D-C cardioversion.   PROCEDURE:  After informed consent was obtained, the patient was sedated  with sodium pentothal under the direction of Dr. Randa Evens of the  anesthesia group.  Initially, 150 J, followed by 200 J of biphasic  energy was delivered to the anterior/posterior electrode dispersion pad.  Sinus rhythm was restored each time; however, the patient developed  early return of A fib (ERAF) and did not maintain sinus rhythm.  Additional D-C cardioversion will be attempted after the patient has  been on additional doses of Tikosyn.      Doylene Canning. Ladona Ridgel, MD  Electronically Signed     GWT/MEDQ  D:  03/04/2006  T:  03/04/2006  Job:  [id number]   cc:   Lenetta Quaker

## 2010-06-04 NOTE — H&P (Signed)
[name], [name]                 ACCOUNT NO.:  [id number]   MEDICAL RECORD NO.:  [id number]         PATIENT TYPE:  LINP   LOCATION:                               FACILITY:  [facility]   PHYSICIAN:  [name], M.D.  DATE OF BIRTH:  Dec 21, 1924   DATE OF ADMISSION:  05/02/2006  DATE OF DISCHARGE:                              HISTORY & PHYSICAL   PROCEDURE TO BE PERFORMED:  Right total knee replacement.   CHIEF COMPLAINT:  Right knee pain.   HISTORY OF PRESENT ILLNESS:  This an 77 year old female with a history  persistent progressive right knee pain.  It has been refractory to all  conservative treatment.  She has been seen previously by Dr. Graciela [name] who  deferred her presurgical clearance to Dr. Sherlyn [name] in [place].  She does  have a history of atrial fibrillation as well as heart disease with  prior myocardial infarction and decreased left ventricular function.  Will make sure to have them on board and available for medical  assistance while she is in the hospital.   PAST MEDICAL HISTORY:  1. Anxiety depression.  2. Asthma.  3. Reflux disease.  4. Osteoarthritis.   PAST SURGICAL HISTORY:  1. Hysterectomy 30 years ago.  2. Cholecystectomy 10 years day.   FAMILY HISTORY:  Heart disease and osteoarthritis.   SOCIAL HISTORY:  The patient is widowed, retired.  Caregiver will be her  children after surgery, if able to go home.   ALLERGIES:  NO KNOWN DRUG ALLERGIES.   MEDICATIONS:  1. Tikosyn 250 mcg one tablet b.i.d.  2. Xopenex nebulizer p.r.n.  3. Toprol XL 50 mg 1/2 tablet p.o. daily.  4. Lexapro one p.o. daily.  5. Nexium 40 mg one p.o. daily.  6. Lanoxin 0.25 mg one p.o. daily.  Please check with the patient      prior to administering as there may have been recent medication      changes.  7. Pravastatin 20 mg 1/2 tablet daily at bedtime.  8. Singular 10 mg one p.o. daily.  9. Coumadin 5 mg one p.o. daily.  10.Lisinopril 10 mg one p.o. daily.  11.Ranitidine 300 mg  one p.o. daily.  12.Nasonex one puff daily.  13.Asmanex two puffs twice daily.  14.Multivitamin daily.  15.Xolair injections twice monthly.   REVIEW OF SYSTEMS:  None other than HPI.   PHYSICAL EXAMINATION:  VITAL SIGNS:  Pulse 60, respirations 18, blood  pressure 116/70.  GENERAL:  Awake, alert and oriented, well-developed, well-nourished, no  acute distress.  Neck:  Supple.  No carotid bruits.  CHEST/LUNGS:  Clear to auscultation bilaterally.  BREASTS:  Deferred.  HEART:  S1-S2 distinct with no significant murmur.  ABDOMEN:  Soft, nontender, nondistended.  Bowel sounds present in all  four quadrants.  GENITOURINARY:  Deferred.  EXTREMITIES:  Painful range of motion, diffuse tenderness.  SKIN:  No cellulitis.  Dorsalis pedis pulse positive.  NEUROLOGIC:  Intact distal sensibilities.   LABORATORY DATA AND X-RAY FINDINGS:  Labs, EKG and chest x-ray are all  pending presurgical clearance.   IMPRESSION:  Right knee osteoarthritis.  PLAN:  The plan of action is right total knee replacement at [facility] May 02, 2006, by surgeon Dr. Durene Romans.  Questions were  encouraged, answered and reviewed.  Risks and complications were  discussed.     ______________________________  Yetta Glassman [name], [place]      [name], M.D.  Electronically Signed    BLM/MEDQ  D:  04/19/2006  T:  04/20/2006  Job:  [id number]   cc:   Harl Bowie, M.D.  Fax: [phone]   Duke Salvia, MD, [facility]  [address]  [address]  [place]  [place] [address]

## 2010-06-04 NOTE — Letter (Signed)
February 17, 2006    Harl Bowie, M.D.  [address]  [place], [place] [address]   RE:  [name], [name]  MRN:  [id number]  /  DOB:  06-24-1924   Dear [name]:   It was a pleasure to talk to you tonight and I hope you and your family  had a great weekend.   [name] came in today with her son.  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  She has a longstanding history of  really rather severe asthma requiring two shots every month in addition  to her nebulizers.  This has been her major clinical problem.  She  presented to the hospital in August 2007 with shortness of breath,  nausea, vomiting, and diarrhea, and was found to be in atrial  fibrillation with a right ventricular response.  She was found to have  LV dysfunction.  She underwent Cardiolite scanning demonstrating an  inferior infarct with mild peri-infarct ischemia.  She was treated  medically with some improvement in her rate control, but her ejection  fraction has remained poor as you have followed it serially through the  fall; and, most recently in early January with the ejection fraction  still at 30-35%.   The patient continues to some symptoms of  exercise intolerance, which  may possibly be attributable to her atrial fibrillation.  She has no  palpitations and had none when she presented initially either.   Her thromboembolic risk factors are notable for depressed left  ventricular function and her age.  They are negative for hypertension,  diabetes and a prior stroke.  She has not had nocturnal dyspnea,  orthopnea or peripheral edema, and has had no syncope.   The past medical history in addition to the above is notable for:  1. Hiatal hernia.  2. GE reflux disease.  3. Arthritis.  4. Anxiety.   Per the review of systems otherwise broadly is negative across multiple  organ systems.   The  patient's past surgical history is notable hysterectomy and  cholecystectomy.   The patient's current medications include Toprol 25 mg a day, presumably  at this dose because of her asthma.  She takes Lexapro 10 mg with much  improvement, Coumadin 5 mg, Lanoxin 0.25, lisinopril 10 mg, ranitidine,  pravastatin 10, Nexium, Actinex, Singulair, Xolair, ___________, and  Xopenex nebulizers.   The patient has no drug allergies.   The longevity in her family is astounding.  Her father died at the age  of 75 and her mother at the age 14.   On examination she is a pleasant, elderly, Caucasian female appearing  her stated age of 9.  Her blood pressure is elevated at 141/92.  Her  pulse is 92 and irregular.  Her weight is 144 pounds.  The head, eyes,  ears, nose and throat exam demonstrates no icterus or xanthoma.  The  neck veins are flat.  The carotids are brisk and full bilaterally  without bruits.  The back is without kyphosis or scoliosis.  The lungs  are clear.  Heart sounds are irregular with a normal S1 and S2.  The  abdomen is soft with active bowel sounds, without midline pulsation or  hepatomegaly.  Femoral pulses are 2+.  Distal pulses are intact. There  is no clubbing, cyanosis or edema.  Neurological is grossly normal.   Electrocardiogram dated today  demonstrates atrial fibrillation at 92  with intervals of __________  /0.14, 0.37, and the axis was leftward and  minus 60 degrees.  It was consistent with a left bundle branch block.  We committed for a treadmill testing.  On a modified Bruce protocol at  an exercise time of 1 minute 28 seconds her heart rate was 137 and 10  seconds later it was 150, and the test was terminated.   IMPRESSION:  1. Atrial fibrillation - persistent, with a rapid ventricular      response.  2. Exercise intolerance for __________  number one.  3. Cardiomyopathy with:      a.     Inferior perfusion defect on Myoview scanning.      b.     Likely  tachycardiac component.      c.     Ejection fraction of 30-35%.  4. Severe asthma.  5. Thromboembolic risk factors notable for age and depressed left      ventricular function.   [name] has atrial fibrillation with a rapid ventricular  response and cardiomyopathy with a perfusion defect likely related to a  myocardial infarction.  I think there are a couple of issues and the  questions that you raised in the beginning as to what degree her  cardiomyopathy is caused by atrial fibrillation; still I beg for the  answer especially in the light of the rapid ventricular response  associated with even modest exercise.   To the end I think that trying to control of her rate first is primary  and direct current cardioversion is worth pursuing, although I suspect  that your reluctance to do this because of her left atrial enlargement  of 4.6 may well hold true here in term of long term maintenance of sinus  rhythm.  However, if we can maintain sinus rhythm even for three to six  weeks we could reassess her left ventricular function to see whether it  has improved.   We discussed other options including antiarrhythmic drug therapy for the  maintenance of sinus rhythm and/or augmented A-V nodal blocking agents.  We also talked about A-V ablation and pacemaker implantation, which  would be a resynchronizing  device.  She favors a non medical approach,  so she and her son were interested in cardioversion; and, if that failed  CRT device implantation with A-V junction ablation.   Thank you very much for undertaking the cardioversion.  I instructed the  patient on how to take her pulse so that she can tell when she is in  atrial fibrillation, again as she has not felt it in the past.  She is  looking forward to hearing from you next week.    Sincerely,      Duke Salvia, MD, [facility]  Electronically Signed   SCK/MedQ  DD: 02/17/2006  DT: 02/18/2006  Job #: [id number]   CC:     Feliciana Rossetti, MD

## 2010-06-04 NOTE — Letter (Signed)
March 17, 2006    Harl Bowie, M.D.  [address]  [place], [place] [address]   RE:  [name], [name]  MRN:  [id number]  /  DOB:  08-29-1924   Dear Lenard Galloway,   I hope this letter finds you well after you have returned home from  [place].  [name] is doing better.  She is in sinus rhythm, on Tikosyn  at 250 mcg b.i.d.  She is tolerating the medication without significant  side effects.  She has had some lethargy since her hospital discharge,  but this is gradually abating.  This may be partly related to her  bradycardia, which obviously was not an issue before, but may be now.   CURRENT MEDICATIONS:  1. Metoprolol 25 mg.  2. Lanoxin 0.25 mg for rate control.  3. Lisinopril.  4. Coumadin.  5. Lexapro.  6. Her chronic obstructive pulmonary disease medications.   PHYSICAL EXAMINATION:  VITAL SIGNS:  Blood pressure 134/82, pulse 54.  LUNGS:  Clear.  HEART:  Sounds were regular.  EXTREMITIES:  Without edema.   An electrocardiogram demonstrated sinus rhythm at 54, with intervals of  0.19/0.15/0.47. The axis was leftward at -60.   IMPRESSION:  1. Paroxysmal atrial fibrillation.  2. Resting bradycardia.  3. Heart disease with a prior myocardial infarction and depressed left      ventricular function.   Lenard Galloway, [name] is doing really quite well.  I wonder a couple of  things:  One is chronotropic incompetence compromising her exercise  capacity.  I was unable to put her on a treadmill today, but this would  be worth doing I think when you see her.  At some point also in the next  six to 12 weeks, a follow-up echocardiogram would be helpful, to know  whether any of her cardiomyopathy is resolved at all.  She did ask about  undergoing her knee surgery.  I said from my point of view, the big  issue would be anticoagulation which should not be interrupted until mid  to late March.   I told her otherwise to discuss her knee surgery with you.  I will plan  to see her again in  three months' time.  Thanks very much for asking [place]  to see her.    Sincerely,      Duke Salvia, MD, [facility]  Electronically Signed    SCK/MedQ  DD: 03/17/2006  DT: 03/17/2006  Job #: [id number]   CC:    Feliciana Rossetti, M.D.

## 2010-06-04 NOTE — H&P (Signed)
NAMEPAULLETTE, MCKAIN                 ACCOUNT NO.:  [id number]   MEDICAL RECORD NO.:  [id number]          PATIENT TYPE:  INP   LOCATION:  2037                         FACILITY:  [facility]   PHYSICIAN:  Bettey Mare. [place], NPDATE OF BIRTH:  [birth year]   DATE OF ADMISSION:  03/02/2006  DATE OF DISCHARGE:  03/07/2006                              HISTORY & PHYSICAL   Audio too short to transcribe (less than 5 seconds)      Bettey Mare. Lyman Bishop, NP     KML/MEDQ  D:  04/17/2006  T:  04/17/2006  Job:  [id number]

## 2010-06-04 NOTE — Discharge Summary (Signed)
NAMETAYGAN, CONNELL                 ACCOUNT NO.:  [id number]   MEDICAL RECORD NO.:  [id number]          PATIENT TYPE:  INP   LOCATION:  2037                         FACILITY:  [facility]   PHYSICIAN:  Duke Salvia, MD, FACCDATE OF BIRTH:  11/13/1924   DATE OF ADMISSION:  03/02/2006  DATE OF DISCHARGE:  03/07/2006                               DISCHARGE SUMMARY   ALLERGIES:  This patient has no known drug allergies.   PRINCIPAL DIAGNOSIS:  1. Atrial fibrillation with rapid ventricular rate.  2. Admitted for Tikosyn therapy.  3. Direct current cardioversion February 16 without success at      conversion.  4. Direct current cardioversion March 06, 2006, successful in      maintaining sinus rhythm.  Patient discharging February 19 in sinus      rhythm.  5. 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  6. Myoview study show presence of inferior infarct with mild peri-      infarct ischemia.   SECONDARY DIAGNOSES:  1. Gastroesophageal reflux disease with hiatal hernia.  2. Anxiety.  3. Asthma.  4. Arthritis.   PROCEDURES THIS ADMISSION.:  1. March 04, 2006, DC cardioversion without success at converting      this to sinus rhythm.  2. March 06, 2006, DC cardioversion successful at initiating sinus      rhythm.  The patient has maintained sinus rhythm for a 30-hour      period after her second cardioversion. Her QT interval has not been      appreciably prolonged on Tikosyn 250 mcg q.12 h.   BRIEF HISTORY:  Ms. Eisenhuth is an 77 year old female with a history of  atrial fibrillation which was discovered August 2007 when the patient  presented to the hospital with dyspnea, nausea, vomiting and diarrhea.  In addition, she was found to have left ventricular dysfunction.  She  underwent Cardiolite study which showed inferior infarct with mild peri-  infarct ischemia.  She has been treated medically since that time with  some improvement in rate  control, but her ejection fraction with serial  studies has remained poor.   The patient continues to have some symptoms of exercise intolerance  which may be possibly attributable to her atrial fibrillation.  She does  not experience palpitations.  Her thromboembolic risk factors are  notable for decreased left ventricular ejection fraction and age. They  are negative for hypertension, diabetes and prior stroke.   Ms. Zwick has atrial fibrillation with rapid ventricular response and  cardiomyopathy.  The patient has a rapid ventricular response even with  modest exercise. Rate control is a primary priority, and a direct  current cardioversion is worth pursuing.  Our aim would be to maintain  sinus rhythm for 3-6 weeks to see if left ventricular function would  improve. Antiarrhythmic drug therapy to maintain sinus rhythm is also an  option.  The patient has decided that she will try medical therapy prior  to talking AV ablation and pacemaker, even though we would be interested  in putting in a resynchronization  device. The patient will be admitted  on February 14 to begin Tikosyn loading.   HOSPITAL COURSE:  The patient presented on the afternoon of February 14.  Her admission laboratories were all within normal limits including her  digoxin level, potassium and magnesium. Her QT interval at base was 372  milliseconds when the patient is in atrial fibrillation, and this is a  difficult interval to quantify.  The patient's creatinine clearance was  calculated, and the patient was found to be able to tolerate 250 mcg  twice daily.  This was started the evening of February 14 and has  continued throughout this hospitalization. On the morning of February  16, the patient underwent DC cardioversion.  This did not maintain sinus  rhythm.  She readily lapsed back into atrial fibrillation. Tikosyn was  continued throughout the weekend, February 16 and 17.  The patient once  again underwent  DC cardioversion on the morning of February 18. This  time the cardioversion held.  The patient maintained sinus rhythm for  the next 30 hours. The patient will be discharging February 19 on this  new medication in addition to her other medications.  Her QT interval on  Tikosyn when in sinus rhythm is about 470, and this has not appreciably  lengthened over time.   The patient will discharge on:  1. Tikosyn 250 mcg twice daily.  She is to take 1 tablet every 12      hours on a regular schedule.  2. Xopenex and ipratropium nebulizer therapy as before this admission.  3. Toprol XL 50 mg tablets 1/2 tablet daily.  4. Lexapro 10 mg daily.  5. Nexium 40 mg daily.  6. Lanoxin 0.25 mg daily.  7. Pravastatin 20 mg tablets 1/2 tablet daily at bedtime.  8. Singulair 10 mg daily.  9. Coumadin 5 mg daily.  10.Lisinopril 10 mg daily.  11.Ranitidine 300 mg daily.  12.Nasonex 1 puff daily.  13.Asmanex 2 puffs twice daily.  14.Multivitamin daily.  15.Xolair injections twice monthly as before this admission.   Of note, the patient's pro time has fluctuated but little on a steady  regimen of Coumadin 5 mg daily this hospitalization. At discharge, she  follows up both at [facility] in [place] and also [facility] in [place].   FOLLOW-UP APPOINTMENTS:  Dr. Graciela Husbands at [facility], [address] [facility], Friday, February 29, at 9:50 in the morning.   Appointments at [facility] [place], [place]  1. Coumadin Clinic on Tuesday, February 26 at 10:30  2. She will see Dr. Sherlyn Lick Monday, March 17 that 10 o'clock.  3. An echocardiogram will be done at Dr. Carole Civil office in about 6      weeks from now. Dr. Carole Civil office will schedule this when the      patient sees Dr. Sherlyn Lick March 17.   LABORATORY STUDIES PERTINENT TO THIS ADMISSION:  Serum electrolytes on  February 18: Sodium 141, potassium 4.3, chloride 108, carbonate 32, BUN 18, creatinine  0.77, glucose 113.  This patient has not needed  exogenous potassium supplementation to maintain a potassium greater than  4 this admission. The pro time on the day of discharge is 26.5 and INR  2.3. BNP on February 17 was 111.  Digoxin level on admission was 1.4.   Dictation: Greater than 35 minutes.      Maple Mirza, [place]      Duke Salvia, MD, [facility]  Electronically Signed  GM/MEDQ  D:  03/07/2006  T:  03/07/2006  Job:  [id number]   cc:   Harl Bowie, M.D.  Feliciana Rossetti, MD

## 2010-06-04 NOTE — Op Note (Signed)
[name], SHORT                 ACCOUNT NO.:  [id number]   MEDICAL RECORD NO.:  [id number]          PATIENT TYPE:  INP   LOCATION:  0006                         FACILITY:  [facility]   PHYSICIAN:  Madlyn Frankel. Charlann Boxer, M.D.  DATE OF BIRTH:  Sep 17, 1924   DATE OF PROCEDURE:  05/02/2006  DATE OF DISCHARGE:                               OPERATIVE REPORT   PREOPERATIVE DIAGNOSIS:  Right knee osteoarthritis.   POSTOPERATIVE DIAGNOSIS:  Right knee osteoarthritis.   PROCEDURE:  Right total knee replacement.   COMPONENTS USED:  DePuy rotating platform knee system with a size 3  femur, 2.5 tibia and a 12.5 insert with a 32 patellar button.   SURGEON:  Durene Romans, M.D.   ASSISTANT:  Dwyane Luo.   ANESTHESIA:  Duramorph with spinal.   BLOOD LOSS:  Minimal.   TOURNIQUET TIME:  Fifty minutes at 250 mmHg.   COMPLICATIONS:  None.   DRAINS:  None.   INDICATIONS FOR PROCEDURE:  Ms. Radliff is a very pleasant 77 year old  female who presented to the office for evaluation of knee pain.  She had  failed conservative measures and had progressive worsening pain over the  past 8 months.  She had decided that she wished to proceed with surgical  intervention rather than continue with conservative measures.  We  reviewed the risks and benefits of knee replacement surgery including  neurovascular injury, dislocation, DVT infection, postoperative  expectations, potential stiffness and need for therapy.  Consent was  obtained.   PROCEDURE IN DETAIL:  The patient was brought to operative theater.  Once adequate anesthesia and preoperative antibiotics administered, 1  gram of Ancef, the patient was positioned in the supine position.  A  proximal thigh tourniquet placed.  The right lower extremity was then  prescrubbed and then prepped and draped in sterile fashion.  A midline  incision was made followed by modified median parapatellar arthrotomy  with patella subluxation rather than eversion.   Following knee exposure, attention was first directed to the patella  where a patella cut was made.  The precut measurement was 23 mm.  I  resected down to 14 mm and used a 32 patellar button.  A metal shim was  placed to protect the patella against retractors.   At this point attention was directed to the femur.  Following further  exposure and debridement of the meniscus and placement of retractors the  drill was inserted into the femoral canal, irrigated to prevent fat  emboli.  I then placed an intramedullary rod.  Due to her short stature  at 5 feet 1 I used 4 degrees of valgus, 10 mm of bone resected.  I then  sized the femur and felt that it was a size 3, pinned the block and then  made the anterior, posterior and chamfer cuts.  These were all done  without complication and no notching.   Trochlear box cut was then made on the center portion of the femur to  house the component without overhang.   Attention was now directed to the tibia.  Tibia exposure retained in  routine fashion and meniscectomies carried out and the posterior stump  and the PCL excised.  Based on the radiographic appearance it shows to  resect 8 mm of bone off the lateral side due to relatively neutral  proximal tibia, despite advanced medial wear.  Using the extramedullary  guide placed down the center portion of her tibia, bisecting the ankle  at 90 degrees.  I then made this cut and determined that this cut  surface measured about 2.5.  I placed a 2.5 tray on their with alignment  rod and found that it did in fact pass through the ankle in the AP and  lateral planes.  It did pass through the center portion of the ankle.   Given this we went ahead and pinned it, drilled and keel punched.  Please note that I did trial with a extensor block and found that the  knee came out to full extension at least with a 10 in and probably a  little bit of hyperextension.   At this point a trial reduction was carried  out with a 3 femur, 2.5  tibial tray and a 2 x 12.5 insert.  The knee came out to full extension  and was very stable from the ligament standpoint from extension into  flexion but the patella tracked without application of pressure through  the center of the trochlea.  Given all this the trial components were  removed.  Final components were brought to the field including the 12.5  insert.  The knee was irrigated.  I injected with 60 mL of 0.25%  Marcaine with epinephrine and 1 mL of 30 mg of Toradol.  I then dried  and prepared the knee while the cement was mixed.   The tibial component was cemented in first followed by the femur and  then the patella.  The 12.5 insert was brought to the field.  The knee  was then brought to extension to allow the cement to cure.  Once  excessive cement that had extruded out was debrided the knee was brought  to flexion to check for any cement in the posterior aspect of the knee.  Once I was satisfied there was no visualized cement anywhere in the knee  the knee was reirrigated and the final 12.5 insert placed.  The knee was  reduced.   Again I irrigated the knee at this point, let the tourniquet down and  then placed 5 mL of FloSeal into the medial and lateral gutters.  There  was very little oozing at this point.  Knee was then brought to flexion  and with a #1 Vicryl I reapproximated the extensor mechanism.  The #2-0  Vicryl was used on the subcu layer followed by running 4-0 Monocryl.  The knee was then cleaned, dried, and dressed sterilely with Steri-  Strips and a sterile bulky Jones dressing.  She was brought to the  recovery room in stable condition.      Madlyn Frankel Charlann Boxer, M.D.  Electronically Signed     MDO/MEDQ  D:  05/02/2006  T:  05/02/2006  Job:  [id number]

## 2010-07-05 ENCOUNTER — Telehealth: Payer: Self-pay | Admitting: Internal Medicine

## 2010-07-05 NOTE — Telephone Encounter (Addendum)
Pt calling re taking plavix and baby aspirin, has bruising, starting almost as soon as started taking them,  does she need blood checked? pls advise pls call after 3p

## 2010-07-05 NOTE — Telephone Encounter (Signed)
I spoke with patient and she wanted to know if bruising was normal when taking Plavix and baby aspirin.   She said she has tried to be careful but still bruises. She also wakes up in the morning and finds that she has new bruises.  I told her that bruising could occur when on these medications and that I would forward this to Dr. Graciela Husbands for review.  She would like a call back if he wishes to change anything.   Mylo Red RN

## 2010-07-13 ENCOUNTER — Encounter: Payer: Self-pay | Admitting: Cardiovascular Disease

## 2010-08-27 ENCOUNTER — Telehealth: Payer: Self-pay | Admitting: *Deleted

## 2010-08-27 DIAGNOSIS — I4891 Unspecified atrial fibrillation: Secondary | ICD-10-CM

## 2010-08-27 NOTE — Telephone Encounter (Signed)
Fax received from [facility] for possible drug interaction- concern was that the patient is taking Plavix & Pradaxa, Nexium, &/or Xarelto. I have called the patient and verified with her that she is only on Plavix and an ASA. I have left this message for [facility] at [phone] ext [phone]. I will fix the patient's medication list in her chart.

## 2010-09-16 ENCOUNTER — Encounter: Payer: Self-pay | Admitting: Internal Medicine

## 2010-09-17 ENCOUNTER — Encounter: Payer: Self-pay | Admitting: Internal Medicine

## 2010-09-17 ENCOUNTER — Ambulatory Visit (INDEPENDENT_AMBULATORY_CARE_PROVIDER_SITE_OTHER): Payer: Medicare Other | Admitting: Internal Medicine

## 2010-09-17 VITALS — BP 142/80 | HR 69 | Ht 63.0 in | Wt 125.0 lb

## 2010-09-17 DIAGNOSIS — I10 Essential (primary) hypertension: Secondary | ICD-10-CM | POA: Insufficient documentation

## 2010-09-17 DIAGNOSIS — I428 Other cardiomyopathies: Secondary | ICD-10-CM

## 2010-09-17 DIAGNOSIS — I4891 Unspecified atrial fibrillation: Secondary | ICD-10-CM

## 2010-09-17 NOTE — Assessment & Plan Note (Signed)
stable °

## 2010-09-17 NOTE — Assessment & Plan Note (Signed)
Patient has paroxysmal atrial fibrillation and she is currently holding sinus rhythm.  We discussed the need for oral anticoagulation and the lack of utility of aspirin Plavix as an alternative. She is agreeable to considering the introduction of apixoban upon his release hopefully later this fall. In the interim, we will discontinue her Plavix

## 2010-09-17 NOTE — Progress Notes (Signed)
  HPI  [name] is a 77 y.o. female seen in followup for atrial fibrillation. Her heart rhythm had been well-controlled on Tikosyn this was discontinued earlier this year because of side effects. She was seen by Dr. Johney Frame for consideration of pulmonary vein isolation; she declined.  She has a history of tachycardia-induced cardiomyopathy which at last evaluation had recovered.  The patient denies SOB, chest pain edema or palpitations.  There has been no syncope or presyncope. She is feeling better than she has in a long time. Her asthma is under control her biggest complaints are the bruising associated with her aspirin/Plavix. She has been unwilling here today to use oral anticoagulation notwithstanding a CHADS VASC score of 4   Past Medical History  Diagnosis Date  . Arrhythmia   . Tachycardia induced cardiomyopathy     Improved  . Anxiety and depression   . Asthma   . Reflux     Reflux disease  . Osteoarthritis   . Urinary incontinence   . Macular degeneration     Past Surgical History  Procedure Date  . Cataract repair     Bilateral  . Abdominal hysterectomy 1982  . Cholecystectomy 1993    Current Outpatient Prescriptions  Medication Sig Dispense Refill  . albuterol (PROVENTIL HFA;VENTOLIN HFA) 108 (90 BASE) MCG/ACT inhaler Inhale 2 puffs into the lungs as needed.        Marland Kitchen aspirin EC 81 MG tablet Take 1 tablet (81 mg total) by mouth daily.      . Calcium 500 MG tablet Take 500 mg by mouth daily. All natural      . clopidogrel (PLAVIX) 75 MG tablet Take 1 tablet (75 mg total) by mouth daily.  90 tablet  3  . dofetilide (TIKOSYN) 125 MCG capsule Take 250 mcg by mouth 2 (two) times daily.        . DULoxetine (CYMBALTA) 30 MG capsule Take 30 mg by mouth daily.        Marland Kitchen lisinopril (PRINIVIL,ZESTRIL) 5 MG tablet Take 5 mg by mouth daily.        . multivitamin (THERAGRAN) per tablet Take 1 tablet by mouth daily. All natural      . solifenacin (VESICARE) 5 MG tablet Take  10 mg by mouth daily.          No Known Allergies  Review of Systems negative except from HPI and PMH  Physical Exam Well developed and well nourished in no acute distress and even smiling HENT normal E scleral and icterus clear Neck Supple JVP flat; carotids brisk and full Clear to ausculation Regular rate and rhythm, no murmurs gallops or rub Soft with active bowel sounds No clubbing cyanosis and edema Alert and oriented, grossly normal motor and sensory function Skin Warm and DrySuperficial ecchymoses on her arm  ECG sinus rhythm at 63 Intervals 0.16/0.15/0.45 Axis is -73 Left bundle branch block  Assessment and  Plan

## 2010-09-17 NOTE — Patient Instructions (Signed)
Your physician recommends that you schedule a follow-up appointment in: January  Your physician has recommended you make the following change in your medication: STOP plavix

## 2010-10-06 ENCOUNTER — Encounter: Payer: Self-pay | Admitting: Cardiovascular Disease

## 2010-12-29 ENCOUNTER — Encounter: Payer: Self-pay | Admitting: Cardiovascular Disease

## 2011-02-15 ENCOUNTER — Encounter: Payer: Self-pay | Admitting: Cardiology

## 2011-02-18 ENCOUNTER — Encounter: Payer: Self-pay | Admitting: Internal Medicine

## 2011-02-18 ENCOUNTER — Ambulatory Visit (INDEPENDENT_AMBULATORY_CARE_PROVIDER_SITE_OTHER): Payer: MEDICARE | Admitting: Internal Medicine

## 2011-02-18 DIAGNOSIS — I4891 Unspecified atrial fibrillation: Secondary | ICD-10-CM

## 2011-02-18 DIAGNOSIS — I429 Cardiomyopathy, unspecified: Secondary | ICD-10-CM

## 2011-02-18 NOTE — Progress Notes (Signed)
  HPI  [name] is a 78 y.o. female seen in followup for atrial fibrillation. Her heart rhythm had been well-controlled on Tikosyn; this was discontinued earlier this year because of side effects. She was seen by Dr. Johney Frame for consideration of pulmonary vein isolation; she declined.  She has a history of tachycardia-induced cardiomyopathy which at last evaluation had recovered.  Was in today wanting to come off of all of her medications. She is taking a number of supplements and she feels "great. She is anticipating knee replacement surgery later this spring.      Past Medical History  Diagnosis Date  . Atrial fibrillation     paroxsymal--intolerant of tikosyn; refused Oral anticoagulation-currently asa/plavix  . Tachycardia induced cardiomyopathy     Improved  . Anxiety and depression   . Asthma   . Reflux     Reflux disease  . Osteoarthritis   . Urinary incontinence   . Macular degeneration   . LBBB (left bundle branch block)     Past Surgical History  Procedure Date  . Cataract repair     Bilateral  . Abdominal hysterectomy 1982  . Cholecystectomy 1993    Current Outpatient Prescriptions  Medication Sig Dispense Refill  . albuterol (PROVENTIL HFA;VENTOLIN HFA) 108 (90 BASE) MCG/ACT inhaler Inhale 2 puffs into the lungs as needed.        Marland Kitchen aspirin EC 81 MG tablet Take 1 tablet (81 mg total) by mouth daily.      . DULoxetine (CYMBALTA) 30 MG capsule Take 30 mg by mouth daily.        Marland Kitchen lisinopril (PRINIVIL,ZESTRIL) 5 MG tablet Take 5 mg by mouth daily.          No Known Allergies  Review of Systems negative except from HPI and PMH  Physical Exam Well developed and well nourished in no acute distress and even smiling HENT normal E scleral and icterus clear Neck Supple JVP flat; carotids brisk and full Clear to ausculation Regular rate and rhythm, no murmurs gallops or rub Soft with active bowel sounds No clubbing cyanosis and edema Alert and oriented,  grossly normal motor and sensory function Skin Warm and DrySuperficial ecchymoses on her arm  CG sinus rhythm at 75 Intervals 0.16/0.15/0.43 Axis is -65 Left bundle branch block  Assessment and  Plan

## 2011-02-18 NOTE — Assessment & Plan Note (Signed)
Spent about 30-40 minutes reviewing anticoagulation whether in fact the patient had had atrial fibrillation and we went back through the old medical record to pullup and  demonstrating ECGs that showed atrial fibrillation. She asked if she could stop her aspirin and based on the data from [place] I have agreed. I told her she should take oral anticoagulation. She demurs

## 2011-02-18 NOTE — Assessment & Plan Note (Signed)
She wanted to stop her ACE inhibitor. I told her that I would not agree with that given her results cardiomyopathy.

## 2011-02-23 ENCOUNTER — Encounter: Payer: Self-pay | Admitting: Internal Medicine

## 2011-05-06 ENCOUNTER — Telehealth: Payer: Self-pay | Admitting: Internal Medicine

## 2011-05-06 NOTE — Telephone Encounter (Signed)
All Cardiac faxed to [facility] @ [phone] 05/06/11/KM

## 2014-09-19 DIAGNOSIS — J4550 Severe persistent asthma, uncomplicated: Secondary | ICD-10-CM | POA: Insufficient documentation

## 2014-09-19 DIAGNOSIS — J449 Chronic obstructive pulmonary disease, unspecified: Secondary | ICD-10-CM | POA: Insufficient documentation

## 2014-09-19 DIAGNOSIS — K219 Gastro-esophageal reflux disease without esophagitis: Secondary | ICD-10-CM | POA: Insufficient documentation

## 2014-09-19 DIAGNOSIS — Z8679 Personal history of other diseases of the circulatory system: Secondary | ICD-10-CM | POA: Insufficient documentation

## 2014-09-29 DIAGNOSIS — I480 Paroxysmal atrial fibrillation: Secondary | ICD-10-CM | POA: Insufficient documentation

## 2014-09-29 DIAGNOSIS — Z7901 Long term (current) use of anticoagulants: Secondary | ICD-10-CM | POA: Insufficient documentation

## 2014-09-29 DIAGNOSIS — I5032 Chronic diastolic (congestive) heart failure: Secondary | ICD-10-CM | POA: Insufficient documentation

## 2014-09-29 DIAGNOSIS — R55 Syncope and collapse: Secondary | ICD-10-CM | POA: Insufficient documentation

## 2014-10-20 ENCOUNTER — Other Ambulatory Visit: Payer: Self-pay | Admitting: *Deleted

## 2014-10-20 NOTE — Patient Outreach (Signed)
[facility]) Care Management  10/20/2014  [name] [birth year] [id number]   RN Health Coach attempted outreach call to Tier 2 patient to discuss services of [facility]. Patient was unavailable. HIPPA compliance voicemail message was left with return callback number.   [name] BSN RN [facility] Care Management [phone]

## 2014-10-20 NOTE — Patient Outreach (Signed)
[facility]) Care Management  10/20/2014  LAWANDA HOLZHEIMER Jul 18, 1924 [id number]   Referral from [employer] Tier 2 List, assigned [name], RN to outreach for [facility] Care Management services.  Thanks, [name]. Sharlee Blew [facility] Care Management [facility] CM Assistant Phone: [phone] Fax: [phone]

## 2014-10-29 ENCOUNTER — Other Ambulatory Visit: Payer: Self-pay | Admitting: *Deleted

## 2014-10-29 DIAGNOSIS — I509 Heart failure, unspecified: Secondary | ICD-10-CM

## 2014-10-29 DIAGNOSIS — J441 Chronic obstructive pulmonary disease with (acute) exacerbation: Secondary | ICD-10-CM

## 2014-10-29 NOTE — Patient Outreach (Signed)
[facility](THN) Care Management  10/29/2014  Hans Edendna C Masso 08-17-1924 [id number]   RN Health Coach telephone call to patient. Hip[employer] compliance verified. Patient has History of congestive heart failure and copd. Discussed services of [facility]. This patient is very agreeable to a telephonic nurse calling her once a month and sending out information. This patient would like to talk with the pharmacist and to evaluate her medications.  She has history of colitis and per patient she is constantly having GI issues with her medications.Patient can afford medication.   Patient would  like for a social worker to help her get into some outpatient counseling.  Per patient she fell about 5-6 weeks ago. No breaks just bruising and a small head laceration. Her son and wife moved in with her.  She has lived by herself for over fifteen years. The son and daughter in law have two animals and they smoke. She feels she needs some help with stress management.  Per patient they are very good to her. She is having difficulty adjusting  to sharing her home.  Assessment  This patient would benefit for Health Coach telephonic outreach for education and support of copd and congestive heart failure This patient could utilize some stress and coping management Patient could benefit from a pharmacy review of medications  Plan: Refer to Health Coach Refer to Social Worker Refer to Pharmacist  Gean MaidensFrances Floyd Wade BSN RN [facility] [phone]

## 2014-10-29 NOTE — Patient Outreach (Signed)
[facility](THN) Care Management  10/29/2014  [name] [birth year] [id number]   Request from Gean MaidensFrances Pleasant, RN to assign RN Health Coach, Pharmacy, and SW, assigned Tyler Deisonnie Rankin, RN, Steve Rattlerawn Pettus, PharmD adn [name], [place].  Thanks, Corrie MckusickLisa O. Sharlee BlewMoore, AABA [name] Care [facility] CM Assistant Phone: [phone] Fax: [phone]

## 2014-10-31 ENCOUNTER — Other Ambulatory Visit: Payer: Self-pay | Admitting: Pharmacist

## 2014-10-31 NOTE — Patient Outreach (Signed)
[facility](THN) Care Management  10/31/2014  Hans Edendna C Mable 12/21/1924 [id number]   Renelda Lomadna Duhon is a 81yo female who [facility], Gean MaidensFrances Pleasant, for medication review.  Patient wanted to talk with pharmacy to evaluate her medications.  I made initial outreach call to Ms. Fabian NovemberFarrow.  Patient answered and reports she is still interested in reviewing her medications, but she states this is not a good time.  I made appointment to call patient back again on Monday, October 17th at [facility]2PM.  I will mail patient a [facility] welcome packet.    Lilla Shookachel Carsyn Taubman, Pharm.D. Pharmacy Resident [facility] [phone]

## 2014-11-03 ENCOUNTER — Other Ambulatory Visit: Payer: Self-pay | Admitting: Pharmacist

## 2014-11-03 NOTE — Patient Outreach (Signed)
[facility]) Care Management  [facility] CM Pharmacy   11/03/2014  [name] December 11, 1924 [id number]  Subjective: [name] is a 81yo female who was referred to [facility] CM Pharmacy from [facility] CMRN, [name], for medication review. Patient expressed interest in talking with pharmacy to evaluate her medications. I called patient as scheduled to review her medications.  Patient's medication list was obtained from [facility] and updated in EPIC.  Patient was able to read off all of her medications to me including doses and how she is taking them.   She reports compliance with current medication regimen.    Patient reports she has a history of colitis and said her stomach is upset most days.  She reports many of her medications cause stomach upset, and many of her medications have been dose reduced for this reason.    I informed patient that [facility] welcome packet was mailed to her.    Objective:   Current Medications: Current Outpatient Prescriptions  Medication Sig Dispense Refill  . albuterol (PROVENTIL HFA;VENTOLIN HFA) 108 (90 BASE) MCG/ACT inhaler Inhale 2 puffs into the lungs as needed.      Marland Kitchen apixaban (ELIQUIS) 2.5 MG TABS tablet Take 2.5 mg by mouth 2 (two) times daily.    Marland Kitchen arformoterol (BROVANA) 15 MCG/2ML NEBU Take 15 mcg by nebulization 2 (two) times daily.    . Ascorbic Acid (VITAMIN C) 100 MG tablet Take 100 mg by mouth daily.    . budesonide (PULMICORT) 0.5 MG/2ML nebulizer solution Take 0.5 mg by nebulization 2 (two) times daily.    . cholecalciferol (VITAMIN D) 1000 UNITS tablet Take 1,000 Units by mouth daily.    Marland Kitchen gabapentin (NEURONTIN) 100 MG capsule Take 200 mg by mouth at bedtime.    . metoprolol tartrate (LOPRESSOR) 25 MG tablet Take 12.5 mg by mouth daily.    . Multiple Vitamin (MULTIVITAMIN) tablet Take 1 tablet by mouth daily.    . pravastatin (PRAVACHOL) 10 MG tablet Take 10 mg by mouth daily.    . Probiotic Product (PROBIOTIC DAILY PO) Take 1 tablet by  mouth daily.    . psyllium (METAMUCIL SMOOTH TEXTURE) 28 % packet Take 1 packet by mouth 2 (two) times daily as needed.    . vitamin A 40981 UNIT capsule Take 10,000 Units by mouth daily.    . vitamin B-12 (CYANOCOBALAMIN) 1000 MCG tablet Take 1,000 mcg by mouth daily.    Marland Kitchen aspirin EC 81 MG tablet Take 1 tablet (81 mg total) by mouth daily.    . beclomethasone (QVAR) 80 MCG/ACT inhaler Inhale into the lungs 2 (two) times daily.    . DULoxetine (CYMBALTA) 30 MG capsule Take 30 mg by mouth daily.      Marland Kitchen ipratropium-albuterol (DUONEB) 0.5-2.5 (3) MG/3ML SOLN Take 3 mLs by nebulization every 4 (four) hours as needed.    . levalbuterol (XOPENEX HFA) 45 MCG/ACT inhaler Inhale into the lungs every 4 (four) hours as needed for wheezing.    Marland Kitchen lisinopril (PRINIVIL,ZESTRIL) 5 MG tablet Take 5 mg by mouth daily.      . montelukast (SINGULAIR) 10 MG tablet Take 10 mg by mouth at bedtime.    . ranitidine (ZANTAC) 300 MG tablet Take 300 mg by mouth daily as needed for heartburn.     No current facility-administered medications for this visit.   Functional Status: In your present state of health, do you have any difficulty performing the following activities: 11/03/2014  Hearing? Y  Vision? N  Difficulty concentrating or making decisions? N  Walking or climbing stairs? Y  Dressing or bathing? N  Doing errands, shopping? Y  Preparing Food and eating ? Y  Using the Toilet? N  In the past six months, have you accidently leaked urine? Y  Do you have problems with loss of bowel control? N  Managing your Medications? Y  Managing your Finances? Y  Housekeeping or managing your Housekeeping? N   Fall/Depression Screening: PHQ 2/9 Scores 10/29/2014  PHQ - 2 Score 1   Assessment:  Drugs sorted by system:  Neurologic/Psychologic: none  Cardiovascular: apixaban, metoprolol tartrate, pravastatin  Pulmonary/Allergy: albuterol HFA, budesonide nebulizer solution, arformoterol nebulizer  solution  Gastrointestinal: metamucil  Endocrine: none  Renal: none  Topical: none  Pain: gabapentin  Vitamins/Minerals: vitamin A, vitamin B12, vitamin C, vitamin D, multivitamin, probiotic  Infectious Diseases: none  Miscellaneous: none  Duplications in therapy: none noted Gaps in therapy: none noted Medications to avoid in the elderly: none noted Drug interactions: none noted Other issues noted:  Patient is currently taking metoprolol tartrate once daily.    Plan: 1.  Medication review:  All medications appear to be appropriate based on patient's problem list.  Apixaban is appropriately dosed based on patient's age and weight.  I believe stomach upset is likely attributed to colitis versus patient's medications.  Patient is currently taking metoprolol tartrate once daily.  Recommend dosing metoprolol tartrate twice daily.  Will send a fax to patient's cardiologist, Dr. Dulce SellarMunley, with this information.  No other issues noted. Will close out of pharmacy program.     Lilla Shookachel Lisamarie Coke, Pharm.D. Pharmacy Resident [facility] [phone]

## 2014-11-05 ENCOUNTER — Other Ambulatory Visit: Payer: Self-pay

## 2014-11-05 NOTE — Patient Outreach (Signed)
[facility](THN) Care Management  11/05/2014  Hans Edendna C Abshier November 23, 1924 [id number]   Telephone call to patient for initial assessment.  Spoke briefly with patient, who requested I call back on Tuesday, Oct 25th.  Tyler Deisonnie Divon Krabill, RN, MSN RN [employer] InternationalHealth Coach [facility] [phone] Fax [phone]

## 2014-11-10 ENCOUNTER — Other Ambulatory Visit: Payer: Self-pay | Admitting: *Deleted

## 2014-11-10 NOTE — Patient Outreach (Signed)
[facility]N) Care Management  11/10/2014  Hans Edendna C Ellefson 17-Jan-1925 [id number]   Phone call to patient to follow up on referrals requested for mental health counseling.  Phone rang, no message able to be left.   Plan:  This Child psychotherapistsocial worker will follow up with patient in 1 week.    Adriana ReamsChrystal Loyal Rudy, LCSW [facility] Care Management [phone]

## 2014-11-11 ENCOUNTER — Other Ambulatory Visit: Payer: Self-pay

## 2014-11-11 ENCOUNTER — Ambulatory Visit: Payer: Self-pay

## 2014-11-11 NOTE — Patient Outreach (Signed)
[facility]) Care Management  [facility] Care Manager  11/11/2014   [name] [birth year] [id number]  Initial telephonic assessment completed today for admission to disease management for CHF.  She reports her weight is steady at 125 pounds, and that she has no pedal edema.  She has a Rx for lasix  ordered prn for an increase in her weight of 3  pounds. She is independent for ADLs, and able to go grocery shopping if there is someone to carry bags for her.  Her ambulation is hindered at times by arthritis in her knees, and she states that her PCP has ordered PT to see if that will help.  She does admit to feeling stressed and somewhat depressed.  Her son and daughter-in-law moved in with her to help care for her, but now the daughter-in-law has been diagnosed with ovarian cancer and just had surgery for same.  Patient reports problem with constipation has improved since she started drinking a full glass of water with her metamucil.  Also reports she is sleeping better since starting on gabapentin at bed time.  Current Medications:  Current Outpatient Prescriptions  Medication Sig Dispense Refill  . albuterol (PROVENTIL HFA;VENTOLIN HFA) 108 (90 BASE) MCG/ACT inhaler Inhale 2 puffs into the lungs as needed.      Marland Kitchen apixaban (ELIQUIS) 2.5 MG TABS tablet Take 2.5 mg by mouth 2 (two) times daily.    Marland Kitchen arformoterol (BROVANA) 15 MCG/2ML NEBU Take 15 mcg by nebulization 2 (two) times daily.    . Ascorbic Acid (VITAMIN C) 100 MG tablet Take 100 mg by mouth daily.    Marland Kitchen aspirin EC 81 MG tablet Take 1 tablet (81 mg total) by mouth daily.    . beclomethasone (QVAR) 80 MCG/ACT inhaler Inhale into the lungs 2 (two) times daily.    . budesonide (PULMICORT) 0.5 MG/2ML nebulizer solution Take 0.5 mg by nebulization 2 (two) times daily.    . cholecalciferol (VITAMIN D) 1000 UNITS tablet Take 1,000 Units by mouth daily.    . DULoxetine (CYMBALTA) 30 MG capsule Take 30 mg by mouth daily.      Marland Kitchen  gabapentin (NEURONTIN) 100 MG capsule Take 200 mg by mouth at bedtime.    Marland Kitchen ipratropium-albuterol (DUONEB) 0.5-2.5 (3) MG/3ML SOLN Take 3 mLs by nebulization every 4 (four) hours as needed.    . levalbuterol (XOPENEX HFA) 45 MCG/ACT inhaler Inhale into the lungs every 4 (four) hours as needed for wheezing.    Marland Kitchen lisinopril (PRINIVIL,ZESTRIL) 5 MG tablet Take 5 mg by mouth daily.      . metoprolol tartrate (LOPRESSOR) 25 MG tablet Take 12.5 mg by mouth daily.    . montelukast (SINGULAIR) 10 MG tablet Take 10 mg by mouth at bedtime.    . Multiple Vitamin (MULTIVITAMIN) tablet Take 1 tablet by mouth daily.    . pravastatin (PRAVACHOL) 10 MG tablet Take 10 mg by mouth daily.    . Probiotic Product (PROBIOTIC DAILY PO) Take 1 tablet by mouth daily.    . psyllium (METAMUCIL SMOOTH TEXTURE) 28 % packet Take 1 packet by mouth 2 (two) times daily as needed.    . ranitidine (ZANTAC) 300 MG tablet Take 300 mg by mouth daily as needed for heartburn.    . vitamin A 40981 UNIT capsule Take 10,000 Units by mouth daily.    . vitamin B-12 (CYANOCOBALAMIN) 1000 MCG tablet Take 1,000 mcg by mouth daily.     No current facility-administered medications for this visit.  Functional Status:  In your present state of health, do you have any difficulty performing the following activities: 11/11/2014 11/11/2014  Hearing? (No Data) Y  Vision? - N  Difficulty concentrating or making decisions? - N  Walking or climbing stairs? - Y  Dressing or bathing? - N  Doing errands, shopping? - Y  Preparing Food and eating ? - Y  Using the Toilet? - N  In the past six months, have you accidently leaked urine? - Y  Do you have problems with loss of bowel control? - N  Managing your Medications? - Y  Managing your Finances? - Y  Housekeeping or managing your Housekeeping? - Y    Fall/Depression Screening: PHQ 2/9 Scores 11/11/2014 11/11/2014 10/29/2014  PHQ - 2 Score 2 1 1   PHQ- 9 Score 5 - -    [facility] CM Care Plan  Problem One        Most Recent Value   Care Plan Problem One  Lack of knowledge of self-managment of congestive heart failure.   Role Documenting the Problem One  Health Coach   Care Plan for Problem One  Active   THN Long Term Goal (31-90 days)  Patient will avoid hospitalization for the next 60 days.   THN Long Term Goal Start Date  11/11/14   Interventions for Problem One Long Term Goal  Education provided re 'stop light tool', and monitoring for weight gain or edema.  Will mail Action Plan magnet.   [facility] CM Short Term Goal #1 (0-30 days)  Patient will avoid falling for the next 30 days.   [facility] CM Short Term Goal #1 Start Date  11/11/14   Interventions for Short Term Goal #1  Education provided on fall prevention measures such as grab rails and securing scatter rugs.  Will mail EMMI materials on fall prevention.      Plan: Patient will continue to monitor daily weights and observe for symptoms of heart failure.           RN Health Coach will mail CHF Stoplight magnet and fall prevention materials.           RN Health Coach will follow up in approximately one month.  Tyler Deisonnie Takara Sermons, RN, MSN RN [employer] InternationalHealth Coach [facility] [phone] Fax [phone]

## 2014-11-18 ENCOUNTER — Other Ambulatory Visit: Payer: Self-pay | Admitting: *Deleted

## 2014-11-18 NOTE — Patient Outreach (Signed)
[facility](THN) Care Management  11/18/2014  [name] [birth year] [id number]   Phone call to patient to offer emotional support and community resources.  Per patient, she  fell and it was recommended that she not live alone.  Patient's son and daughter in law now have been living with patient.  Per patient, she is having difficulty adjusting as she has lived alone for the last 15 years.  Patient's son is disabled and daughter in law has been diagnosed with cancer and also has mental health issues according to patient.  Patient reports having limited support due to daughter in law's illness.   Patient declined outpatient mental health referrals at this time, however is open to this social worker calling to follow up.  Supportive counseling and emotional support provided.   Plan:  Follow up phone call 11/19/14  Adriana ReamsChrystal Land, LCSW [facility] Care Management [phone]

## 2014-11-28 ENCOUNTER — Other Ambulatory Visit: Payer: Self-pay | Admitting: Allergy and Immunology

## 2014-12-01 ENCOUNTER — Other Ambulatory Visit: Payer: Self-pay | Admitting: *Deleted

## 2014-12-01 NOTE — Patient Outreach (Signed)
[facility]N) Care Management  12/01/2014  [name] 10/02/1924 [id number]   Follow up phone call to patient to assess for community resource needs and continued social work involvement.  Patient's daughter in law answered the phone stating that she was not home.   This Child psychotherapistsocial worker will call back this afternoon.    Adriana ReamsChrystal Land, LCSW [facility] Care Management [phone]

## 2014-12-02 ENCOUNTER — Other Ambulatory Visit: Payer: Self-pay | Admitting: *Deleted

## 2014-12-02 NOTE — Patient Outreach (Signed)
[facility]N) Care Management  12/02/2014  [name] 1924/09/16 [id number]   Phone call to patient to assess for continued social work needs.  Per patient her daughter in law had surgery to remove the cancer and she is doing much better.  Per patient, she and her husband will continue to reside with due to their financial circumstances   "I have accepted this".    Patient states that her daughter is coming yo visit in a couple of weeks and she will take her to see her sister in [place]. . Patient further discussed re-establishing her friendships by going to the [facility].  Patient states that she went last week and will be going tomorrow.   Per patient, she has found pleasure in her [facility] the [facility] and is looking forward to seeing her sister in [place].   Patient declined referrals to a therapist at this time, stating that going [facility] the [facility] and connecting with old friends "is like good therapy".  This Child psychotherapistsocial worker promoted getting out more to increase self care.  Encouraged patient to contact this social worker if any needs arise in the future.   Case to be closed to social work at this time.  RNCM to be notified.    Adriana ReamsChrystal Mariaelena Cade, LCSW [facility] Care Management [phone]

## 2014-12-09 ENCOUNTER — Other Ambulatory Visit: Payer: Self-pay

## 2014-12-09 NOTE — Patient Outreach (Signed)
[facility]) Care Management  [facility] Care Manager  12/09/2014   [name] February 18, 1924 [id number]  Telephonic assessment conducted with patient.  She reports using the EMMI materials and the CHF Action Plan materials.  Her weight is stable with no sudden increases.  She is weighing daily.  Patient states she did go to PT for one week for her knee pain, and is now going to the [facility] to exercise. She also stated she enjoys the socialization at the [facility].  Patient asked about resources to help pay for a hearing aid.  RN Health Coach will consult with SW and others to locate a resource if possible.  Her constipation is controlled with Metamucil.  Patient also states she is sleeping better since her gabapentin was increased to 300 mg at bed time.  Current Medications:  Current Outpatient Prescriptions  Medication Sig Dispense Refill  . apixaban (ELIQUIS) 2.5 MG TABS tablet Take 2.5 mg by mouth 2 (two) times daily.    Marland Kitchen arformoterol (BROVANA) 15 MCG/2ML NEBU Take 15 mcg by nebulization 2 (two) times daily.    . Ascorbic Acid (VITAMIN C) 100 MG tablet Take 100 mg by mouth daily.    Marland Kitchen aspirin EC 81 MG tablet Take 1 tablet (81 mg total) by mouth daily.    . beclomethasone (QVAR) 80 MCG/ACT inhaler Inhale into the lungs 2 (two) times daily.    . budesonide (PULMICORT) 0.5 MG/2ML nebulizer solution Take 0.5 mg by nebulization 2 (two) times daily.    . cholecalciferol (VITAMIN D) 1000 UNITS tablet Take 1,000 Units by mouth daily.    . DULoxetine (CYMBALTA) 30 MG capsule Take 30 mg by mouth daily.      Marland Kitchen gabapentin (NEURONTIN) 100 MG capsule Take 200 mg by mouth at bedtime.    Marland Kitchen ipratropium-albuterol (DUONEB) 0.5-2.5 (3) MG/3ML SOLN Take 3 mLs by nebulization every 4 (four) hours as needed.    . levalbuterol (XOPENEX HFA) 45 MCG/ACT inhaler Inhale into the lungs every 4 (four) hours as needed for wheezing.    Marland Kitchen lisinopril (PRINIVIL,ZESTRIL) 5 MG tablet Take 5 mg by mouth daily.       . metoprolol tartrate (LOPRESSOR) 25 MG tablet Take 12.5 mg by mouth daily.    . montelukast (SINGULAIR) 10 MG tablet Take 10 mg by mouth at bedtime.    . Multiple Vitamin (MULTIVITAMIN) tablet Take 1 tablet by mouth daily.    . pravastatin (PRAVACHOL) 10 MG tablet Take 10 mg by mouth daily.    Marland Kitchen PROAIR HFA 108 (90 BASE) MCG/ACT inhaler USE 2 INHALATIONS EVERY 4 TO 6 HOURS AS NEEDED FOR COUGH OR WHEEZE 25.5 g 1  . Probiotic Product (PROBIOTIC DAILY PO) Take 1 tablet by mouth daily.    . psyllium (METAMUCIL SMOOTH TEXTURE) 28 % packet Take 1 packet by mouth 2 (two) times daily as needed.    . ranitidine (ZANTAC) 300 MG tablet Take 300 mg by mouth daily as needed for heartburn.    . vitamin A 40981 UNIT capsule Take 10,000 Units by mouth daily.    . vitamin B-12 (CYANOCOBALAMIN) 1000 MCG tablet Take 1,000 mcg by mouth daily.     No current facility-administered medications for this visit.   THN CM Care Plan Problem One        Most Recent Value   Care Plan Problem One  Lack of knowledge of self-managment of congestive heart failure.   Role Documenting the Problem One  Health Coach   Care  Plan for Problem One  Active   THN Long Term Goal (31-90 days)  Patient will avoid hospitalization for the next 60 days.   THN Long Term Goal Start Date  11/11/14   Interventions for Problem One Long Term Goal  Education provided re 'stop light tool', and monitoring for weight gain or edema.  Will mail Action Plan magnet. [Reinforced use of stop light tool. ]   THN CM Short Term Goal #1 (0-30 days)  Patient will avoid falling for the next 30 days.   [facility] CM Short Term Goal #1 Start Date  11/11/14   Interventions for Short Term Goal #1  Pt used EMMI to evaluate home safety.  Reinforced fall precautions.       Assessment: No acute CHF episodes in past month.  Plan: Patient to continue following self-management guidelines for CHF.           Patient will keep cardiologist appointment in December.            RN Health will contact SW about hearing aide resources.           RN will follow up after patient returns from her trip to [place] at Christmas.  [name], RN, MSN RN [employer] InternationalHealth Coach [facility] [phone] Fax [phone]

## 2014-12-09 NOTE — Patient Outreach (Signed)
[facility]) Care Management  12/09/2014  Hans Edendna C Simms 11-Jul-1924 [id number]   Telephone call to patient for scheduled appointment.  Patient stated she was just leaving the house to go out for lunch.  Stated she would call me back this afternoon.  Appointment set for 3PM today.  Tyler Deisonnie Roylene Heaton, RN, MSN RN [employer] InternationalHealth Coach [facility] [phone] Fax [phone]

## 2014-12-15 ENCOUNTER — Telehealth: Payer: Self-pay

## 2014-12-15 NOTE — Telephone Encounter (Signed)
PT WANTED TO LET YOU KNOW THAT SHE IS DOING VERY WELL AT THIS TIME. USING ALL MEDS, GETTING READY TO GO TO [place] FOR THE HOLIDAYS. LAST SEEN IN June 2016. WHEN DO YOU WANT TO SEE HER AGAIN?

## 2014-12-15 NOTE — Telephone Encounter (Signed)
After [place] from [place] vacation in January 2017

## 2015-01-08 ENCOUNTER — Other Ambulatory Visit: Payer: Self-pay | Admitting: *Deleted

## 2015-01-08 NOTE — Patient Outreach (Addendum)
[facility](THN) Care Management  01/08/2015  [name] 1924-07-11 [id number]   Phone call to patient to discuss resources for a hearing aid.  Per patient, she has had a hearing test in the past, however could not afford the hearing aid recommended.  Per patient, she receives a widowers pension through the [employer] and have been in contact with a representative for [employer] benefits who believes that she would be eligible for hearing aids at no cost.  However [place] have to [place] the [place].  They will not send the paperwork in the mail. Patient states that she would be able to have transportation arranged and will probably do this after the holidays.  This Child psychotherapistsocial worker also discussed the program through the Division of Services for the Deaf and Hard of hearing.  It was explained that if financially eligible, she would receive one hearing aid at no cost.  Patient would have to have a recent hearing test through a doctor that is contracted with this program. [facility] was found to be one of the contracted providers.  Plan: Per patient, she will try working with  [employer] first and if that does not work out, she will call this Child psychotherapistsocial worker back for assistance with the Division of Services for [place] Deaf and and Hard of Hearing.  This Child psychotherapistsocial worker provided patient with my contact information if needed in the future.    Adriana ReamsChrystal Land, LCSW [facility] Care Management [phone]

## 2015-01-14 ENCOUNTER — Other Ambulatory Visit: Payer: Self-pay

## 2015-01-14 DIAGNOSIS — I509 Heart failure, unspecified: Secondary | ICD-10-CM

## 2015-01-14 NOTE — Patient Outreach (Addendum)
[name]) Care Management  [place]  01/14/2015   [name] 11-29-1924 [id number]  Telephone assessment with patient today.  She states she was able to visit her sister in [place] during Christmas, and her daughter from [place]. [place] has been visiting with her as well. Reports she has had some mild ankle swelling but no change in her weight and no other "Yellow Zone" symptoms.  States she continues to sleep better with Cymbalta.  Patient reports she has not been walking or going to the [facility] during the holidays, but that she is ready to start back after the first of the year.  She states she has not fallen recently.  Patient reports that several members of her family have been sick with what appears to be the flu.  Educated patient about avoiding contact with anyone who is ill.  Patient's main concern remains getting a hearing aide.  She has spoken with [facility] about this.  She states she has contact information for someone at the [place] in [place] and plans to call him next week.  She is concerned about transportation from [place] to the [place] should they give her an appointment.  Current Medications:  Current Outpatient Prescriptions  Medication Sig Dispense Refill  . metoprolol tartrate (LOPRESSOR) 25 MG tablet Take 12.5 mg by mouth.    Marland Kitchen apixaban (ELIQUIS) 2.5 MG TABS tablet Take 2.5 mg by mouth 2 (two) times daily.    Marland Kitchen apixaban (ELIQUIS) 2.5 MG TABS tablet Take 2.5 mg by mouth.    Marland Kitchen arformoterol (BROVANA) 15 MCG/2ML NEBU Take 15 mcg by nebulization 2 (two) times daily.    . Ascorbic Acid (VITAMIN C) 100 MG tablet Take 100 mg by mouth daily.    Marland Kitchen aspirin EC 81 MG tablet Take 1 tablet (81 mg total) by mouth daily.    . beclomethasone (QVAR) 80 MCG/ACT inhaler Inhale into the lungs 2 (two) times daily.    . budesonide (PULMICORT) 0.5 MG/2ML nebulizer solution Take 0.5 mg by nebulization 2 (two) times daily.    . cholecalciferol (VITAMIN D) 1000 UNITS tablet  Take 1,000 Units by mouth daily.    . DULoxetine (CYMBALTA) 30 MG capsule Take 30 mg by mouth daily.      Marland Kitchen gabapentin (NEURONTIN) 100 MG capsule Take 200 mg by mouth at bedtime.    Marland Kitchen ipratropium-albuterol (DUONEB) 0.5-2.5 (3) MG/3ML SOLN Take 3 mLs by nebulization every 4 (four) hours as needed.    . levalbuterol (XOPENEX HFA) 45 MCG/ACT inhaler Inhale into the lungs every 4 (four) hours as needed for wheezing.    Marland Kitchen lisinopril (PRINIVIL,ZESTRIL) 5 MG tablet Take 5 mg by mouth daily.      . metoprolol tartrate (LOPRESSOR) 25 MG tablet Take 12.5 mg by mouth daily.    . montelukast (SINGULAIR) 10 MG tablet Take 10 mg by mouth at bedtime.    . Multiple Vitamin (MULTIVITAMIN) tablet Take 1 tablet by mouth daily.    . pravastatin (PRAVACHOL) 10 MG tablet Take 10 mg by mouth daily.    . pravastatin (PRAVACHOL) 10 MG tablet Take 10 mg by mouth.    Marland Kitchen PROAIR HFA 108 (90 BASE) MCG/ACT inhaler USE 2 INHALATIONS EVERY 4 TO 6 HOURS AS NEEDED FOR COUGH OR WHEEZE 25.5 g 1  . Probiotic Product (PROBIOTIC DAILY PO) Take 1 tablet by mouth daily.    . psyllium (METAMUCIL SMOOTH TEXTURE) 28 % packet Take 1 packet by mouth 2 (two) times daily as  needed.    . ranitidine (ZANTAC) 300 MG tablet Take 300 mg by mouth daily as needed for heartburn.    . vitamin A 10000 UNIT capsule Take 10,000 Units by mouth daily.    . vitamin B-12 (CYANOCOBALAMIN) 1000 MCG tablet Take 1,000 mcg by mouth daily.     No current facility-administered medications for this visit.   THN CM Care Plan Problem One        Most Recent Value   Care Plan Problem One  Lack of knowledge of self-managment of congestive heart failure.   Role Documenting the Problem One  [place] for Problem One  Active   THN Long Term Goal (31-90 days)  Patient will avoid hospitalization for the next 60 days.   THN Long Term Goal Start Date  11/11/14   THN Long Term Goal Met Date  01/14/15   Interventions for Problem One Long Term Goal  Patient is  using Action Plan. [Reinforced use of stop light tool. ]   THN CM Short Term Goal #1 (0-30 days)  Patient will avoid falling for the next 30 days.   [facility] CM Short Term Goal #1 Start Date  11/11/14   [facility] CM Short Term Goal #1 Met Date  01/14/15   Interventions for Short Term Goal #1  Pt used EMMI to evaluate home safety.  Reinforced fall precautions.      Assessment: Patient demonstrates increased knowledge in the use of the CHF Action Plan.   Plan: Patient will continue to monitor daily weights and use her CHF Action Plan to self-monitor her CHF.           Patient will contact the [place] concerning an appointment for her hearing.           RN Health Coach will collaborate with [place] concerning patient's plans for going to the [place] and                           her concerns about transportation there.            RN Health Coach will follow up within one month.

## 2015-01-14 NOTE — Patient Outreach (Signed)
[facility]N) Care Management  01/14/2015  [name] [birth year] [id number]   Attempted call for scheduled appointment.  The person answering the phone stated that patient was not at home. Will attempt call later today.  Tyler Deisonnie Rankin, RN, MSN RN [employer] [facility] [phone] Fax [phone]

## 2015-02-12 ENCOUNTER — Ambulatory Visit (INDEPENDENT_AMBULATORY_CARE_PROVIDER_SITE_OTHER): Payer: MEDICARE | Admitting: Allergy and Immunology

## 2015-02-12 ENCOUNTER — Other Ambulatory Visit: Payer: Self-pay

## 2015-02-12 ENCOUNTER — Encounter: Payer: Self-pay | Admitting: Allergy and Immunology

## 2015-02-12 VITALS — BP 130/72 | HR 72 | Resp 20 | Ht 62.0 in | Wt 134.5 lb

## 2015-02-12 DIAGNOSIS — Z8709 Personal history of other diseases of the respiratory system: Secondary | ICD-10-CM

## 2015-02-12 DIAGNOSIS — Z8719 Personal history of other diseases of the digestive system: Secondary | ICD-10-CM | POA: Diagnosis not present

## 2015-02-12 DIAGNOSIS — J4550 Severe persistent asthma, uncomplicated: Secondary | ICD-10-CM

## 2015-02-12 MED ORDER — ARFORMOTEROL TARTRATE 15 MCG/2ML IN NEBU: 15.0000 ug | INHALATION_SOLUTION | Freq: Two times a day (BID) | RESPIRATORY_TRACT | Status: AC

## 2015-02-12 NOTE — Progress Notes (Signed)
[facility]  Follow-up Note  Referring Provider: Lise Auer, MD Primary Provider: Lise Auer, MD Date of Office Visit: 02/12/2015  Subjective:   [name] is a 82 y.o. female who [facility] on 02/12/2015 in re-evaluation of the following:  HPI Comments: [name] returns to this clinic in reevaluation of her COPD with a component of asthma and her allergic rhinitis and history of reflux. She is done quite well over the course of the past 6 months with no exacerbations of her lung condition no need to use any short acting bronchodilator and no need to use a systemic steroid. She has had a very good 6 month period and is very happy about the response she is received. She has had no problems with her nose and does not use any specific therapy directed against this organ. She's had no problems with her reflux and does not use any specific therapy directed against this organ. She did obtain the flu vaccine.   Current Outpatient Prescriptions on File Prior to Visit  Medication Sig Dispense Refill  . apixaban (ELIQUIS) 2.5 MG TABS tablet Take 2.5 mg by mouth 2 (two) times daily.    . Ascorbic Acid (VITAMIN C) 100 MG tablet Take 100 mg by mouth daily.    Marland Kitchen aspirin EC 81 MG tablet Take 1 tablet (81 mg total) by mouth daily.    . budesonide (PULMICORT) 0.5 MG/2ML nebulizer solution Take 0.5 mg by nebulization 2 (two) times daily.    . cholecalciferol (VITAMIN D) 1000 UNITS tablet Take 1,000 Units by mouth daily.    Marland Kitchen gabapentin (NEURONTIN) 100 MG capsule Take 200 mg by mouth at bedtime.    . metoprolol tartrate (LOPRESSOR) 25 MG tablet Take 12.5 mg by mouth daily.    . Multiple Vitamin (MULTIVITAMIN) tablet Take 1 tablet by mouth daily.    . pravastatin (PRAVACHOL) 10 MG tablet Take 10 mg by mouth daily.    Marland Kitchen PROAIR HFA 108 (90 BASE) MCG/ACT inhaler USE 2 INHALATIONS EVERY 4 TO 6 HOURS AS NEEDED FOR COUGH OR  WHEEZE 25.5 g 1  . vitamin A 30160 UNIT capsule Take 10,000 Units by mouth daily.    . vitamin B-12 (CYANOCOBALAMIN) 1000 MCG tablet Take 1,000 mcg by mouth daily.     No current facility-administered medications on file prior to visit.    Meds ordered this encounter  Medications  . arformoterol (BROVANA) 15 MCG/2ML NEBU    Sig: Take 2 mLs (15 mcg total) by nebulization 2 (two) times daily.    Dispense:  360 mL    Refill:  1    Past Medical History  Diagnosis Date  . Atrial fibrillation (HCC)     paroxsymal--intolerant of tikosyn; refused Oral anticoagulation-currently asa/plavix  . Tachycardia induced cardiomyopathy (HCC)     Improved  . Anxiety and depression   . Asthma   . Reflux     Reflux disease  . Osteoarthritis   . Urinary incontinence   . Macular degeneration   . LBBB (left bundle branch block)   . CHF (congestive heart failure) (HCC)   . Hypertension   . COPD (chronic obstructive pulmonary disease) (HCC)   . Anxiety     Past Surgical History  Procedure Laterality Date  . Cataract repair      Bilateral  . Abdominal hysterectomy  1982  . Cholecystectomy  1993    Allergies  Allergen  Reactions  . Amoxicillin Diarrhea    Review of systems negative except as noted in HPI / PMHx or noted below:  Review of Systems  Constitutional: Negative.   HENT: Negative.   Eyes: Negative.   Respiratory: Negative.   Cardiovascular: Negative.   Gastrointestinal: Negative.   Genitourinary: Negative.   Musculoskeletal: Negative.   Skin: Negative.   Neurological: Negative.   Endo/Heme/Allergies: Negative.   Psychiatric/Behavioral: Negative.      Objective:   Filed Vitals:   02/12/15 1457  BP: 130/72  Pulse: 72  Resp: 20   Height:  (157.5 cm)  Weight: 134 lb 7.7 oz (61 kg)   Physical Exam  Constitutional: She is well-developed, well-nourished, and in no distress.  HENT:  Head: Normocephalic.  Right Ear: Tympanic membrane, external ear and ear canal  normal.  Left Ear: Tympanic membrane, external ear and ear canal normal.  Nose: Nose normal. No mucosal edema or rhinorrhea.  Mouth/Throat: Uvula is midline, oropharynx is clear and moist and mucous membranes are normal. No oropharyngeal exudate.  Eyes: Conjunctivae are normal.  Neck: Trachea normal. No tracheal tenderness present. No tracheal deviation present. No thyromegaly present.  Cardiovascular: Normal rate, regular rhythm, S1 normal, S2 normal and normal heart sounds.   No murmur heard. Pulmonary/Chest: Breath sounds normal. No stridor. No respiratory distress. She has no wheezes. She has no rales.  Musculoskeletal: She exhibits no edema.  Lymphadenopathy:       Head (right side): No tonsillar adenopathy present.       Head (left side): No tonsillar adenopathy present.    She has no cervical adenopathy.    She has no axillary adenopathy.  Neurological: She is alert. Gait normal.  Skin: No rash noted. She is not diaphoretic. No erythema. Nails show no clubbing.  Psychiatric: Mood and affect normal.    Diagnostics:    Spirometry was performed and demonstrated an FEV1 of 0.89 at 63 % of predicted.  The patient had an Asthma Control Test with the following results:  .    Assessment and Plan:   1. Severe persistent asthma, uncomplicated   2. History of gastroesophageal reflux (GERD)   3. History of allergic rhinitis     1. Continue Brovana nebulized twice a day  2. Continue Pulmicort 0.5 mg nebulized twice a day  3. Continue DuoNeb nebulization or Xopenex HFA if needed  4. May restart ranitidine 300 mg once a day if needed  5. Return to clinic in 6 months or earlier if problem  [name] is doing very well and I see no need for changing her medical therapy at this point in time. She can return to this clinic in approximately 6 months or earlier if there is a problem.  Joanna Schimke, MD [facility]

## 2015-02-12 NOTE — Patient Outreach (Signed)
[facility]) Care Management  02/12/2015  [name] 03-17-1924 [id number]   Telephone call for scheduled contact.  Daughter reports patient is not at home this am, but will return by lunch time. RN will call back this afternoon.  Tyler Deis, RN, MSN RN [employer] [phone] Fax [phone]

## 2015-02-12 NOTE — Patient Outreach (Signed)
[facility]) Care Management  [place]  02/12/2015   [name] [birth year] [id number]  Telephonic assessment completed.  Patient reports she is in '[place]' for CHF Action Plan.  She states her weight is stable with no sudden increases, and she has no edema. She is adhering to a low salt diet.  Patient also reports she is sleeping well with Cymbalta at bed time.  She is also going to the [facility] regularly for exercise.  Her chief concern today is still wanting hearing aides.  She did call the [place], and was told she is not eligible to go there.  She is open to other suggestions, including going to [employer], but there is no one near her.  Current Medications:  Current Outpatient Prescriptions  Medication Sig Dispense Refill  . apixaban (ELIQUIS) 2.5 MG TABS tablet Take 2.5 mg by mouth 2 (two) times daily.    Marland Kitchen apixaban (ELIQUIS) 2.5 MG TABS tablet Take 2.5 mg by mouth.    Marland Kitchen arformoterol (BROVANA) 15 MCG/2ML NEBU Take 15 mcg by nebulization 2 (two) times daily.    . Ascorbic Acid (VITAMIN C) 100 MG tablet Take 100 mg by mouth daily.    Marland Kitchen aspirin EC 81 MG tablet Take 1 tablet (81 mg total) by mouth daily.    . beclomethasone (QVAR) 80 MCG/ACT inhaler Inhale into the lungs 2 (two) times daily.    . budesonide (PULMICORT) 0.5 MG/2ML nebulizer solution Take 0.5 mg by nebulization 2 (two) times daily.    . cholecalciferol (VITAMIN D) 1000 UNITS tablet Take 1,000 Units by mouth daily.    . DULoxetine (CYMBALTA) 30 MG capsule Take 30 mg by mouth daily.      Marland Kitchen gabapentin (NEURONTIN) 100 MG capsule Take 200 mg by mouth at bedtime.    Marland Kitchen ipratropium-albuterol (DUONEB) 0.5-2.5 (3) MG/3ML SOLN Take 3 mLs by nebulization every 4 (four) hours as needed.    . levalbuterol (XOPENEX HFA) 45 MCG/ACT inhaler Inhale into the lungs every 4 (four) hours as needed for wheezing.    Marland Kitchen lisinopril (PRINIVIL,ZESTRIL) 5 MG tablet Take 5 mg by mouth daily.      . metoprolol tartrate (LOPRESSOR)  25 MG tablet Take 12.5 mg by mouth daily.    . metoprolol tartrate (LOPRESSOR) 25 MG tablet Take 12.5 mg by mouth.    . montelukast (SINGULAIR) 10 MG tablet Take 10 mg by mouth at bedtime.    . Multiple Vitamin (MULTIVITAMIN) tablet Take 1 tablet by mouth daily.    . pravastatin (PRAVACHOL) 10 MG tablet Take 10 mg by mouth daily.    . pravastatin (PRAVACHOL) 10 MG tablet Take 10 mg by mouth.    Marland Kitchen PROAIR HFA 108 (90 BASE) MCG/ACT inhaler USE 2 INHALATIONS EVERY 4 TO 6 HOURS AS NEEDED FOR COUGH OR WHEEZE 25.5 g 1  . Probiotic Product (PROBIOTIC DAILY PO) Take 1 tablet by mouth daily.    . psyllium (METAMUCIL SMOOTH TEXTURE) 28 % packet Take 1 packet by mouth 2 (two) times daily as needed.    . ranitidine (ZANTAC) 300 MG tablet Take 300 mg by mouth daily as needed for heartburn.    . vitamin A 10000 UNIT capsule Take 10,000 Units by mouth daily.    . vitamin B-12 (CYANOCOBALAMIN) 1000 MCG tablet Take 1,000 mcg by mouth daily.     No current facility-administered medications for this visit.   [facility] CM Care Plan Problem One        Most  Recent Value   Care Plan Problem One  Lack of knowledge of self-managment of congestive heart failure.   Role Documenting the Problem One  [place] for Problem One  Active   THN Long Term Goal (31-90 days)  Patient will avoid hospitalization for the next 60 days.   THN Long Term Goal Start Date  11/11/14   [facility] Long Term Goal Met Date  01/14/15   Interventions for Problem One Long Term Goal  Reviewed Action Plan. [Reinforced use of stop light tool. ]   THN CM Short Term Goal #1 (0-30 days)  Patient will avoid falling for the next 30 days.   THN CM Short Term Goal #1 Start Date  11/11/14   [facility] CM Short Term Goal #1 Met Date  01/14/15   Interventions for Short Term Goal #1  Reinforced fall prevention strategies      Fall screening completed today.  Patient reports no recent falls.  Education was reinforced about fall prevention.   Assessment:  Patient demonstrates increased knowledge of CHF Action Plan.  Plan: Refer back to SW for hearing aide resources.           RN will follow up within one month and evaluate for discharge.  Candie Mile, RN, MSN [place] [phone] Fax [phone]

## 2015-02-12 NOTE — Patient Instructions (Signed)
  1. Continue Brovana nebulized twice a day  2. Continue Pulmicort 0.5 mg nebulized twice a day  3. Continue DuoNeb nebulization or Xopenex HFA if needed  4. May restart ranitidine 300 mg once a day if needed  5. Return to clinic in 6 months or earlier if problem

## 2015-03-12 ENCOUNTER — Other Ambulatory Visit: Payer: Self-pay

## 2015-03-12 NOTE — Patient Outreach (Signed)
[facility]) Care Management  03/12/2015  [name] 10-Dec-1924 [id number]  Unsuccessful attempt to reach patient for scheduled assessment.  HIPPA appropriate message left requesting call back.  RN  will make another attempt to reach patient within one week if no response.  Tyler Deis, RN, MSN RN [employer] [phone] Fax [phone]

## 2015-03-17 ENCOUNTER — Other Ambulatory Visit: Payer: Self-pay

## 2015-03-17 NOTE — Patient Outreach (Signed)
[facility]) Care Management  03/17/2015  [name] [birth year] [id number]  Telephone contact today.  Patient reports no issues with CHF.  She has met her goals.  She is using CHF Action Plan to monitor her heart failure, and she has remained stable and able to avoid hospitalization.  She plans to go to [employer] to see about getting a hearing aide.  Plan:  Patient will continue self-management strategies.            RN will close case as of today.

## 2015-03-20 ENCOUNTER — Other Ambulatory Visit: Payer: Self-pay | Admitting: *Deleted

## 2015-03-20 NOTE — Patient Outreach (Signed)
[facility]) Care Management  03/20/2015  [name] 1924-12-30 [id number]   Phone call to patient to follow up on resources needed to obtain a hearing aid.  Per patient, [facility] did not work out. Because she is not a disabled veteran, she could not receive assistance with this.   This social work discussed the Clinical cytogeneticistTelephonic Equipment Distribution Program through the Department of Health and Health and safety inspectorHuman Services for the Deaf and Hard of Hearing, however patient does not meet the income guidelines.  Per patient[facility]  Per patient, the cost would be approximately $1200.00 which is half of what she paid for her first pair.  Per patient, she will follow up with [employer] on her own. Patient verbalized having no further social work needs at this time.  This Child psychotherapistsocial worker encouraged patient to call if any further social work needs arise.   Adriana ReamsChrystal Land, LCSW [facility] Care Management [phone]

## 2015-06-11 ENCOUNTER — Ambulatory Visit (INDEPENDENT_AMBULATORY_CARE_PROVIDER_SITE_OTHER): Payer: MEDICARE | Admitting: Allergy and Immunology

## 2015-06-11 ENCOUNTER — Encounter: Payer: Self-pay | Admitting: Allergy and Immunology

## 2015-06-11 VITALS — BP 106/62 | HR 80 | Resp 24

## 2015-06-11 DIAGNOSIS — Z8709 Personal history of other diseases of the respiratory system: Secondary | ICD-10-CM

## 2015-06-11 DIAGNOSIS — J45909 Unspecified asthma, uncomplicated: Secondary | ICD-10-CM | POA: Diagnosis not present

## 2015-06-11 DIAGNOSIS — Z8719 Personal history of other diseases of the digestive system: Secondary | ICD-10-CM

## 2015-06-11 DIAGNOSIS — J449 Chronic obstructive pulmonary disease, unspecified: Secondary | ICD-10-CM

## 2015-06-11 DIAGNOSIS — J4489 Other specified chronic obstructive pulmonary disease: Secondary | ICD-10-CM

## 2015-06-11 MED ORDER — IPRATROPIUM-ALBUTEROL 0.5-2.5 (3) MG/3ML IN SOLN: RESPIRATORY_TRACT | Status: DC

## 2015-06-11 MED ORDER — LEVALBUTEROL TARTRATE 45 MCG/ACT IN AERO: INHALATION_SPRAY | RESPIRATORY_TRACT | Status: DC

## 2015-06-11 MED ORDER — IPRATROPIUM-ALBUTEROL 0.5-2.5 (3) MG/3ML IN SOLN: RESPIRATORY_TRACT | Status: AC

## 2015-06-11 MED ORDER — LEVALBUTEROL TARTRATE 45 MCG/ACT IN AERO: INHALATION_SPRAY | RESPIRATORY_TRACT | Status: AC

## 2015-06-11 NOTE — Progress Notes (Signed)
Follow-up Note  Referring Provider: Lise Auer, MD Primary Provider: Lise Auer, MD Date of Office Visit: 06/11/2015  Subjective:   [name] (DOB: [birth year]) is a 82 y.o. female who [facility] in re-evaluation of the following:  HPI: Tamaria presents this clinic in evaluation of her COPD with asthma and allergic rhinitis and distant history of reflux. I've not seen her in his clinic since January 2017.  During the interval she is done very well until the past 3 months. She's noticed that she's had a little bit more problems breathing. She is more short of breath and having some wheezing. She's not really sure that the nebulizer is working very well. Her Pulmicort and Brovana never empty out of the containment bulb of her nebulizer. She does not have any chest pain. She's not had any fast heart rate. She's not had any swelling of her legs or weight gain. She's not had any problems with her nose. She's had no problems with reflux and her throat is doing well. She had a visit with her cardiologist last week who gave her a pretty clear bill of health regarding her heart.  Daven brings in her nebulizer today and the output hose is kinked.    Medication List           arformoterol 15 MCG/2ML Nebu  Commonly known as:  BROVANA  Take 2 mLs (15 mcg total) by nebulization 2 (two) times daily.     aspirin EC 81 MG tablet  Take 81 mg by mouth daily. Reported on 06/11/2015     budesonide 0.5 MG/2ML nebulizer solution  Commonly known as:  PULMICORT  Take 0.5 mg by nebulization 2 (two) times daily.     cholecalciferol 1000 units tablet  Commonly known as:  VITAMIN D  Take 1,000 Units by mouth daily.     digoxin 0.125 MG tablet  Commonly known as:  LANOXIN     ELIQUIS 2.5 MG Tabs tablet  Generic drug:  apixaban  Take 2.5 mg by mouth 2 (two) times daily.     furosemide 40 MG tablet  Commonly known as:  LASIX  Reported on 06/11/2015     gabapentin 100 MG capsule  Commonly known as:  NEURONTIN  Take 200 mg by mouth at bedtime.     losartan 50 MG tablet  Commonly known as:  COZAAR  Take 50 mg by mouth. Reported on 06/11/2015     metoprolol tartrate 25 MG tablet  Commonly known as:  LOPRESSOR  Take 12.5 mg by mouth daily.     multivitamin tablet  Take 1 tablet by mouth daily.     pravastatin 10 MG tablet  Commonly known as:  PRAVACHOL  Take 10 mg by mouth daily.     PROAIR HFA 108 (90 Base) MCG/ACT inhaler  Generic drug:  albuterol  USE 2 INHALATIONS EVERY 4 TO 6 HOURS AS NEEDED FOR COUGH OR WHEEZE     vitamin A 16109 UNIT capsule  Take 10,000 Units by mouth daily.     vitamin B-12 1000 MCG tablet  Commonly known as:  CYANOCOBALAMIN  Take 1,000 mcg by mouth daily.     vitamin C 100 MG tablet  Take 100 mg by mouth daily.        Past Medical History  Diagnosis Date  . Atrial fibrillation (HCC)     paroxsymal--intolerant of tikosyn; refused Oral anticoagulation-currently asa/plavix  . Tachycardia induced  cardiomyopathy (HCC)     Improved  . Anxiety and depression   . Asthma   . Reflux     Reflux disease  . Osteoarthritis   . Urinary incontinence   . Macular degeneration   . LBBB (left bundle branch block)   . CHF (congestive heart failure) (HCC)   . Hypertension   . COPD (chronic obstructive pulmonary disease) (HCC)   . Anxiety     Past Surgical History  Procedure Laterality Date  . Cataract repair      Bilateral  . Abdominal hysterectomy  1982  . Cholecystectomy  1993    Allergies  Allergen Reactions  . Amoxicillin Diarrhea    Review of systems negative except as noted in HPI / PMHx or noted below:  Review of Systems  Constitutional: Negative.   HENT: Negative.   Eyes: Negative.   Respiratory: Negative.   Cardiovascular: Negative.   Gastrointestinal: Negative.   Genitourinary: Negative.   Musculoskeletal: Negative.   Skin: Negative.   Neurological: Negative.     Endo/Heme/Allergies: Negative.   Psychiatric/Behavioral: Negative.      Objective:   Filed Vitals:   06/11/15 1347  BP: 106/62  Pulse: 80  Resp: 24          Physical Exam  Constitutional: She is well-developed, well-nourished, and in no distress.  HENT:  Head: Normocephalic.  Right Ear: Tympanic membrane, external ear and ear canal normal.  Left Ear: Tympanic membrane, external ear and ear canal normal.  Nose: Nose normal. No mucosal edema or rhinorrhea.  Mouth/Throat: Uvula is midline, oropharynx is clear and moist and mucous membranes are normal. No oropharyngeal exudate.  Eyes: Conjunctivae are normal.  Neck: Trachea normal. No tracheal tenderness present. No tracheal deviation present. No thyromegaly present.  Cardiovascular: Normal rate, regular rhythm, S1 normal, S2 normal and normal heart sounds.   No murmur heard. Pulmonary/Chest: Breath sounds normal. No stridor. No respiratory distress. She has no wheezes. She has no rales.  Musculoskeletal: She exhibits no edema.  Lymphadenopathy:       Head (right side): No tonsillar adenopathy present.       Head (left side): No tonsillar adenopathy present.    She has no cervical adenopathy.  Neurological: She is alert. Gait normal.  Skin: No rash noted. She is not diaphoretic. No erythema. Nails show no clubbing.  Psychiatric: Mood and affect normal.    Diagnostics:    Spirometry was performed and demonstrated an FEV1 of 0.56 at 40 % of predicted.  Oxygen saturation was 94% on room air at rest  Assessment and Plan:   1. COPD with asthma (HCC)   2. History of gastroesophageal reflux (GERD)   3. History of allergic rhinitis     1. Continue Brovana nebulized twice a day  2. Continue Pulmicort 0.5 mg nebulized twice a day  3. Continue DuoNeb nebulization or Xopenex HFA if needed  4. May restart ranitidine 300 mg once a day if needed  5. Return to clinic in 4 weeks or earlier if problem  We have given [name] a  new nebulizer hose to use on her nebulizer today and we'll see what happens over the course the next several weeks as she restarts her proton and Pulmicort. She has the option of using DuoNeb or Xopenex if needed and should she develop problems once again with issues revolving around her throat from her reflux she can always restart her ranitidine.  Laurette SchimkeEric Tyreke Kaeser, MD  [facility]

## 2015-06-11 NOTE — Patient Instructions (Signed)
  1. Continue Brovana nebulized twice a day  2. Continue Pulmicort 0.5 mg nebulized twice a day  3. Continue DuoNeb nebulization or Xopenex HFA if needed  4. May restart ranitidine 300 mg once a day if needed  5. Return to clinic in 4 weeks or earlier if problem

## 2015-06-30 ENCOUNTER — Other Ambulatory Visit: Payer: Self-pay | Admitting: Allergy and Immunology

## 2015-10-12 ENCOUNTER — Ambulatory Visit (INDEPENDENT_AMBULATORY_CARE_PROVIDER_SITE_OTHER): Payer: MEDICARE | Admitting: Allergy and Immunology

## 2015-10-12 ENCOUNTER — Ambulatory Visit: Payer: MEDICARE | Admitting: Allergy and Immunology

## 2015-10-12 ENCOUNTER — Encounter: Payer: Self-pay | Admitting: Allergy and Immunology

## 2015-10-12 VITALS — BP 140/82 | HR 88 | Resp 22

## 2015-10-12 DIAGNOSIS — Z8719 Personal history of other diseases of the digestive system: Secondary | ICD-10-CM

## 2015-10-12 DIAGNOSIS — J441 Chronic obstructive pulmonary disease with (acute) exacerbation: Secondary | ICD-10-CM | POA: Diagnosis not present

## 2015-10-12 DIAGNOSIS — Z8709 Personal history of other diseases of the respiratory system: Secondary | ICD-10-CM

## 2015-10-12 DIAGNOSIS — J45901 Unspecified asthma with (acute) exacerbation: Secondary | ICD-10-CM

## 2015-10-12 MED ORDER — RANITIDINE HCL 300 MG PO TABS: ORAL_TABLET | ORAL | 1 refills | Status: AC

## 2015-10-12 MED ORDER — METHYLPREDNISOLONE ACETATE 80 MG/ML IJ SUSP
80.0000 mg | Freq: Once | INTRAMUSCULAR | Status: AC
  Administered 2015-10-12: 80 mg via INTRAMUSCULAR

## 2015-10-12 NOTE — Progress Notes (Signed)
Follow-up Note  Referring Provider: Lise AuerKhan, Jaber A, MD Primary Provider: Lise AuerKHAN,JABER A, MD Date of Office Visit: 10/12/2015  Subjective:   [name] (DOB: 06-Feb-1924) is a 82 y.o. female who [facility] in re-evaluation of the following:  HPI: [name] presents this clinic in evaluation of her COPD with asthma and allergic rhinitis and distant history of reflux. I've not seen her in his clinic since May 2017.  During the interval she has done relatively well although she's had some problems with "colitis". She did not really have much breathing problems as long she continued to use her nebulizer with Brovana and budesonide. Rarely does she use any short acting bronchodilator.   However, yesterday she feels as though she acquired a cold with runny nose and some slight sore throat and this morning she's had some cough and some chest pain in her sternal region. This chest pain is pleuritic and is actually tender for her to press on her chest. There is no fever or ugly sputum production and she does not have any other associated systemic or constitutional symptoms. She did use her short-acting bronchodilator today but it did not really help very much.   She has not been having any issues associated with reflux at this point.    Medication List      arformoterol 15 MCG/2ML Nebu Commonly known as:  BROVANA Take 2 mLs (15 mcg total) by nebulization 2 (two) times daily.   BENEFIBER PO Take by mouth.   budesonide 0.5 MG/2ML nebulizer solution Commonly known as:  PULMICORT USE 1 VIAL IN NEBULIZER TWICE A DAY TO PREVENT ASTHMA FLARE   cholecalciferol 1000 units tablet Commonly known as:  VITAMIN D Take 1,000 Units by mouth daily.   COLACE PO Take by mouth.   digoxin 0.125 MG tablet Commonly known as:  LANOXIN   ELIQUIS 2.5 MG Tabs tablet Generic drug:  apixaban Take 2.5 mg by mouth 2 (two) times daily.   furosemide 40 MG  tablet Commonly known as:  LASIX as needed. Reported on 06/11/2015   gabapentin 100 MG capsule Commonly known as:  NEURONTIN Take 200 mg by mouth at bedtime.   HYDROcodone-acetaminophen 5-325 MG tablet Commonly known as:  NORCO/VICODIN   ipratropium-albuterol 0.5-2.5 (3) MG/3ML Soln Commonly known as:  DUONEB Can inhale the contents of one vial in the nebulizer every four to six hours as needed for cough or wheeze.   levalbuterol 45 MCG/ACT inhaler Commonly known as:  XOPENEX HFA Inhale two puffs every four to six hours as needed for cough or wheeze.   linaclotide 145 MCG Caps capsule Commonly known as:  LINZESS Take by mouth.   losartan 50 MG tablet Commonly known as:  COZAAR Take 50 mg by mouth. Reported on 06/11/2015   metoprolol tartrate 25 MG tablet Commonly known as:  LOPRESSOR Take 12.5 mg by mouth daily.   multivitamin tablet Take 1 tablet by mouth daily. vitamin A-vit C-vit E-zinc-Cu Tab   pravastatin 10 MG tablet Commonly known as:  PRAVACHOL Take 10 mg by mouth daily.   vitamin B-12 1000 MCG tablet Commonly known as:  CYANOCOBALAMIN Take 1,000 mcg by mouth daily.       Past Medical History:  Diagnosis Date  . Anxiety   . Anxiety and depression   . Asthma   . Atrial fibrillation (HCC)    paroxsymal--intolerant of tikosyn; refused Oral anticoagulation-currently asa/plavix  . CHF (congestive heart failure) (HCC)   .  COPD (chronic obstructive pulmonary disease) (HCC)   . Diverticulitis   . Hypertension   . LBBB (left bundle branch block)   . Macular degeneration   . Osteoarthritis   . Reflux    Reflux disease  . Tachycardia induced cardiomyopathy (HCC)    Improved  . Urinary incontinence     Past Surgical History:  Procedure Laterality Date  . ABDOMINAL HYSTERECTOMY  1982  . Cataract repair     Bilateral  . CHOLECYSTECTOMY  1993    Allergies  Allergen Reactions  . Amoxicillin Diarrhea    Review of systems negative except as noted in  HPI / PMHx or noted below:  Review of Systems  Constitutional: Negative.   HENT: Negative.   Eyes: Negative.   Respiratory: Negative.   Cardiovascular: Negative.   Gastrointestinal: Negative.   Genitourinary: Negative.   Musculoskeletal: Negative.   Skin: Negative.   Neurological: Negative.   Endo/Heme/Allergies: Negative.   Psychiatric/Behavioral: Negative.      Objective:   Vitals:   10/12/15 1355  BP: 140/82  Pulse: 88  Resp: (!) 22          Physical Exam  Constitutional: She is well-developed, well-nourished, and in no distress.  HENT:  Head: Normocephalic.  Right Ear: Tympanic membrane, external ear and ear canal normal.  Left Ear: Tympanic membrane, external ear and ear canal normal.  Nose: Nose normal. No mucosal edema or rhinorrhea.  Mouth/Throat: Uvula is midline, oropharynx is clear and moist and mucous membranes are normal. No oropharyngeal exudate.  Eyes: Conjunctivae are normal.  Neck: Trachea normal. No tracheal tenderness present. No tracheal deviation present. No thyromegaly present.  Cardiovascular: S1 normal, S2 normal and normal heart sounds.  An irregularly irregular rhythm present.  No murmur heard. Pulmonary/Chest: Breath sounds normal. No stridor. No respiratory distress. She has no wheezes. She has no rales.  Musculoskeletal: She exhibits no edema.  Lymphadenopathy:       Head (right side): No tonsillar adenopathy present.       Head (left side): No tonsillar adenopathy present.    She has no cervical adenopathy.  Neurological: She is alert. Gait normal.  Skin: No rash noted. She is not diaphoretic. No erythema. Nails show no clubbing.  Psychiatric: Mood and affect normal.    Diagnostics: none   Assessment and Plan:   1. Acute exacerbation of COPD with asthma (HCC)   2. History of gastroesophageal reflux (GERD)   3. History of allergic rhinitis     1. Continue Brovana nebulized twice a day  2. Continue Pulmicort 0.5 mg nebulized  twice a day  3. Continue DuoNeb nebulization or Xopenex HFA if needed  4. Restart ranitidine 300 mg once a day if needed  5. Depomedrol 80 IM delivered in clinic today  6. Can use tylenol if needed.  7. Return to clinic in 4 weeks or earlier if problem  I'm not exactly sure why Chanese has significant pleuritic chest pain and tenderness of her sternal region but this could be secondary to a viral respiratory tract infection with a flare of her respiratory tract inflammation and we'll treat her with a systemic steroid as noted above. As well, given her prior history of reflux I would like her to restart her ranitidine while she is experiencing this issue with sternal chest pain. She can take Tylenol if she needs to for pain. I'll see her back in this clinic in approximately 4 weeks or earlier if there is a problem. Certainly if  she continues with significant problems she'll require further evaluation and treatment.  Laurette Schimke, MD [facility]

## 2015-10-12 NOTE — Patient Instructions (Addendum)
  1. Continue Brovana nebulized twice a day  2. Continue Pulmicort 0.5 mg nebulized twice a day  3. Continue DuoNeb nebulization or Xopenex HFA if needed  4. Restart ranitidine 300 mg once a day if needed  5. Depomedrol 80 IM delivered in clinic today  6. Can use tylenol if needed.  7. Return to clinic in 4 weeks or earlier if problem

## 2015-10-13 ENCOUNTER — Encounter: Payer: Self-pay | Admitting: Allergy and Immunology

## 2015-10-26 DIAGNOSIS — E873 Alkalosis: Secondary | ICD-10-CM | POA: Diagnosis not present

## 2015-10-26 DIAGNOSIS — J189 Pneumonia, unspecified organism: Secondary | ICD-10-CM

## 2015-10-26 DIAGNOSIS — N390 Urinary tract infection, site not specified: Secondary | ICD-10-CM | POA: Diagnosis not present

## 2015-10-26 DIAGNOSIS — J45901 Unspecified asthma with (acute) exacerbation: Secondary | ICD-10-CM

## 2015-10-26 DIAGNOSIS — E876 Hypokalemia: Secondary | ICD-10-CM

## 2015-10-26 DIAGNOSIS — R112 Nausea with vomiting, unspecified: Secondary | ICD-10-CM

## 2015-10-26 DIAGNOSIS — I482 Chronic atrial fibrillation: Secondary | ICD-10-CM

## 2015-10-27 DIAGNOSIS — I482 Chronic atrial fibrillation: Secondary | ICD-10-CM | POA: Diagnosis not present

## 2015-10-27 DIAGNOSIS — E873 Alkalosis: Secondary | ICD-10-CM | POA: Diagnosis not present

## 2015-10-27 DIAGNOSIS — E876 Hypokalemia: Secondary | ICD-10-CM | POA: Diagnosis not present

## 2015-10-27 DIAGNOSIS — N390 Urinary tract infection, site not specified: Secondary | ICD-10-CM | POA: Diagnosis not present

## 2015-10-28 DIAGNOSIS — I482 Chronic atrial fibrillation: Secondary | ICD-10-CM

## 2015-10-28 DIAGNOSIS — R112 Nausea with vomiting, unspecified: Secondary | ICD-10-CM

## 2015-10-28 DIAGNOSIS — E873 Alkalosis: Secondary | ICD-10-CM | POA: Diagnosis not present

## 2015-10-28 DIAGNOSIS — E876 Hypokalemia: Secondary | ICD-10-CM

## 2015-10-28 DIAGNOSIS — N390 Urinary tract infection, site not specified: Secondary | ICD-10-CM

## 2015-10-28 DIAGNOSIS — J189 Pneumonia, unspecified organism: Secondary | ICD-10-CM

## 2015-10-28 DIAGNOSIS — J45901 Unspecified asthma with (acute) exacerbation: Secondary | ICD-10-CM

## 2015-11-02 DIAGNOSIS — I4891 Unspecified atrial fibrillation: Secondary | ICD-10-CM

## 2015-11-02 DIAGNOSIS — R7989 Other specified abnormal findings of blood chemistry: Secondary | ICD-10-CM

## 2015-11-02 DIAGNOSIS — Z8709 Personal history of other diseases of the respiratory system: Secondary | ICD-10-CM | POA: Diagnosis not present

## 2015-11-02 DIAGNOSIS — J189 Pneumonia, unspecified organism: Secondary | ICD-10-CM | POA: Diagnosis not present

## 2015-11-02 DIAGNOSIS — E875 Hyperkalemia: Secondary | ICD-10-CM

## 2015-11-02 DIAGNOSIS — I5022 Chronic systolic (congestive) heart failure: Secondary | ICD-10-CM | POA: Diagnosis not present

## 2015-11-03 DIAGNOSIS — J189 Pneumonia, unspecified organism: Secondary | ICD-10-CM | POA: Diagnosis not present

## 2015-11-03 DIAGNOSIS — Z8709 Personal history of other diseases of the respiratory system: Secondary | ICD-10-CM | POA: Diagnosis not present

## 2015-11-03 DIAGNOSIS — I5022 Chronic systolic (congestive) heart failure: Secondary | ICD-10-CM | POA: Diagnosis not present

## 2015-11-03 DIAGNOSIS — R7989 Other specified abnormal findings of blood chemistry: Secondary | ICD-10-CM | POA: Diagnosis not present

## 2015-11-04 DIAGNOSIS — J189 Pneumonia, unspecified organism: Secondary | ICD-10-CM | POA: Diagnosis not present

## 2015-11-04 DIAGNOSIS — Z8709 Personal history of other diseases of the respiratory system: Secondary | ICD-10-CM | POA: Diagnosis not present

## 2015-11-04 DIAGNOSIS — R7989 Other specified abnormal findings of blood chemistry: Secondary | ICD-10-CM | POA: Diagnosis not present

## 2015-11-04 DIAGNOSIS — I5022 Chronic systolic (congestive) heart failure: Secondary | ICD-10-CM | POA: Diagnosis not present

## 2015-11-05 DIAGNOSIS — J189 Pneumonia, unspecified organism: Secondary | ICD-10-CM | POA: Diagnosis not present

## 2015-11-05 DIAGNOSIS — Z8709 Personal history of other diseases of the respiratory system: Secondary | ICD-10-CM | POA: Diagnosis not present

## 2015-11-05 DIAGNOSIS — R7989 Other specified abnormal findings of blood chemistry: Secondary | ICD-10-CM | POA: Diagnosis not present

## 2015-11-05 DIAGNOSIS — I5022 Chronic systolic (congestive) heart failure: Secondary | ICD-10-CM | POA: Diagnosis not present

## 2015-11-22 DIAGNOSIS — R531 Weakness: Secondary | ICD-10-CM

## 2015-11-22 DIAGNOSIS — I509 Heart failure, unspecified: Secondary | ICD-10-CM

## 2015-11-22 DIAGNOSIS — E43 Unspecified severe protein-calorie malnutrition: Secondary | ICD-10-CM

## 2015-11-22 DIAGNOSIS — I482 Chronic atrial fibrillation: Secondary | ICD-10-CM

## 2015-11-22 DIAGNOSIS — J159 Unspecified bacterial pneumonia: Secondary | ICD-10-CM | POA: Diagnosis not present

## 2015-11-22 DIAGNOSIS — J9691 Respiratory failure, unspecified with hypoxia: Secondary | ICD-10-CM

## 2015-11-22 DIAGNOSIS — Z8709 Personal history of other diseases of the respiratory system: Secondary | ICD-10-CM

## 2015-11-22 DIAGNOSIS — J90 Pleural effusion, not elsewhere classified: Secondary | ICD-10-CM

## 2015-11-23 DIAGNOSIS — J159 Unspecified bacterial pneumonia: Secondary | ICD-10-CM | POA: Diagnosis not present

## 2015-11-23 DIAGNOSIS — J90 Pleural effusion, not elsewhere classified: Secondary | ICD-10-CM | POA: Diagnosis not present

## 2015-11-23 DIAGNOSIS — I509 Heart failure, unspecified: Secondary | ICD-10-CM | POA: Diagnosis not present

## 2015-11-23 DIAGNOSIS — J9691 Respiratory failure, unspecified with hypoxia: Secondary | ICD-10-CM | POA: Diagnosis not present

## 2015-11-24 DIAGNOSIS — J9691 Respiratory failure, unspecified with hypoxia: Secondary | ICD-10-CM | POA: Diagnosis not present

## 2015-11-24 DIAGNOSIS — I509 Heart failure, unspecified: Secondary | ICD-10-CM | POA: Diagnosis not present

## 2015-11-24 DIAGNOSIS — J159 Unspecified bacterial pneumonia: Secondary | ICD-10-CM | POA: Diagnosis not present

## 2015-11-24 DIAGNOSIS — J90 Pleural effusion, not elsewhere classified: Secondary | ICD-10-CM | POA: Diagnosis not present

## 2015-11-25 DIAGNOSIS — I482 Chronic atrial fibrillation: Secondary | ICD-10-CM

## 2015-11-25 DIAGNOSIS — E43 Unspecified severe protein-calorie malnutrition: Secondary | ICD-10-CM

## 2015-11-25 DIAGNOSIS — J90 Pleural effusion, not elsewhere classified: Secondary | ICD-10-CM

## 2015-11-25 DIAGNOSIS — R531 Weakness: Secondary | ICD-10-CM

## 2015-11-25 DIAGNOSIS — I509 Heart failure, unspecified: Secondary | ICD-10-CM | POA: Diagnosis not present

## 2015-11-25 DIAGNOSIS — J159 Unspecified bacterial pneumonia: Secondary | ICD-10-CM | POA: Diagnosis not present

## 2015-11-25 DIAGNOSIS — Z8709 Personal history of other diseases of the respiratory system: Secondary | ICD-10-CM

## 2015-11-25 DIAGNOSIS — J9691 Respiratory failure, unspecified with hypoxia: Secondary | ICD-10-CM

## 2015-11-26 DIAGNOSIS — J159 Unspecified bacterial pneumonia: Secondary | ICD-10-CM | POA: Diagnosis not present

## 2015-11-26 DIAGNOSIS — J90 Pleural effusion, not elsewhere classified: Secondary | ICD-10-CM | POA: Diagnosis not present

## 2015-11-26 DIAGNOSIS — I509 Heart failure, unspecified: Secondary | ICD-10-CM | POA: Diagnosis not present

## 2015-11-26 DIAGNOSIS — J9691 Respiratory failure, unspecified with hypoxia: Secondary | ICD-10-CM | POA: Diagnosis not present

## 2015-12-18 DEATH — deceased
